# Patient Record
Sex: Male | Born: 1965 | Race: White | Hispanic: No | Marital: Single | Smoking: Current every day smoker
Health system: Southern US, Community
[De-identification: ages and names within clinical notes are randomized; demographics above are authoritative.]

## PROBLEM LIST (undated history)

## (undated) DIAGNOSIS — I10 Essential (primary) hypertension: Secondary | ICD-10-CM

## (undated) DIAGNOSIS — C140 Malignant neoplasm of pharynx, unspecified: Secondary | ICD-10-CM

## (undated) HISTORY — PX: OTHER SURGICAL HISTORY: SHX169

---

## 2013-10-04 ENCOUNTER — Emergency Department: Payer: Self-pay | Admitting: Emergency Medicine

## 2014-04-10 ENCOUNTER — Emergency Department: Payer: Self-pay | Admitting: Emergency Medicine

## 2015-01-15 ENCOUNTER — Emergency Department
Admit: 2015-01-15 | Disposition: A | Discharge status: Home / Self Care | Payer: Self-pay | Admitting: Emergency Medicine

## 2015-03-04 ENCOUNTER — Encounter: Payer: Self-pay | Admitting: Emergency Medicine

## 2015-03-04 ENCOUNTER — Emergency Department: Payer: Self-pay

## 2015-03-04 ENCOUNTER — Emergency Department
Admission: EM | Admit: 2015-03-04 | Discharge: 2015-03-04 | Disposition: A | Discharge status: Home / Self Care | Payer: Self-pay | Attending: Emergency Medicine | Admitting: Emergency Medicine

## 2015-03-04 DIAGNOSIS — W1849XA Other slipping, tripping and stumbling without falling, initial encounter: Secondary | ICD-10-CM | POA: Insufficient documentation

## 2015-03-04 DIAGNOSIS — Y998 Other external cause status: Secondary | ICD-10-CM | POA: Insufficient documentation

## 2015-03-04 DIAGNOSIS — Y9389 Activity, other specified: Secondary | ICD-10-CM | POA: Insufficient documentation

## 2015-03-04 DIAGNOSIS — Y9289 Other specified places as the place of occurrence of the external cause: Secondary | ICD-10-CM | POA: Insufficient documentation

## 2015-03-04 DIAGNOSIS — Z9889 Other specified postprocedural states: Secondary | ICD-10-CM | POA: Insufficient documentation

## 2015-03-04 DIAGNOSIS — Z720 Tobacco use: Secondary | ICD-10-CM | POA: Insufficient documentation

## 2015-03-04 DIAGNOSIS — S93401A Sprain of unspecified ligament of right ankle, initial encounter: Secondary | ICD-10-CM | POA: Insufficient documentation

## 2015-03-04 NOTE — ED Notes (Signed)
Pt c/o right ankle pain/swelling since last night around midnight; tripped in his bathroom and twisted the ankle; pt says he injured the same ankle in 1993 when he fell off a roof and has had intermittent problems with it since

## 2015-03-04 NOTE — ED Notes (Signed)
NAD noted at time of D/C. Pt denies questions or concerns. Pt taken to the lobby via wheelchair at this time.  

## 2015-03-04 NOTE — ED Notes (Signed)
Pt offered wheelchair at time of D/C. Pt refused wheelchair stating he would rather walk out.

## 2015-03-04 NOTE — ED Provider Notes (Signed)
[facility] Emergency Department Provider Note  ____________________________________________  Time seen: Approximately 620 AM  I have reviewed the triage vital signs and the nursing notes.   HISTORY  Chief Complaint Ankle Pain    HPI Alan Chung is a 49 y.o. male with a history of remote right ankle fracture presents tonight after tripping in the bathroom last night and now with anterior ankle pain. Patient previously had surgery on the ankle in 1993 when he fell from roof. Able to ambulate on the ankle at this time. Says has mildly increased swelling. Pain is anterior and sharp. Says always has some degree of swelling to the right ankle.did not hit head when fell. No loss of consciousness.   History reviewed. No pertinent past medical history.  There are no active problems to display for this patient.   Past Surgical History  Procedure Laterality Date  . Right ankle surgery      Current Outpatient Rx  Name  Route  Sig  Dispense  Refill  . ibuprofen (ADVIL,MOTRIN) 800 MG tablet   Oral   Take 800 mg by mouth every 8 (eight) hours as needed.           Allergies Tramadol  No family history on file.  Social History History  Substance Use Topics  . Smoking status: Current Every Day Smoker  . Smokeless tobacco: Never Used  . Alcohol Use: No    Review of Systems Constitutional: No fever/chills Eyes: No visual changes. ENT: No sore throat. Cardiovascular: Denies chest pain. Respiratory: Denies shortness of breath. Gastrointestinal: No abdominal pain.  No nausea, no vomiting.  No diarrhea.  No constipation. Genitourinary: Negative for dysuria. Musculoskeletal: Negative for back pain. Skin: Negative for rash. Neurological: Negative for headaches, focal weakness or numbness.  10-point ROS otherwise negative.  ____________________________________________   PHYSICAL EXAM:  VITAL SIGNS: ED Triage Vitals  Enc Vitals Group     BP  03/04/15 0531 147/82 mmHg     Pulse Rate 03/04/15 0531 81     Resp 03/04/15 0531 18     Temp 03/04/15 0531 97.8 F (36.6 C)     Temp Source 03/04/15 0531 Oral     SpO2 03/04/15 0531 99 %     Weight 03/04/15 0531 240 lb (108.863 kg)     Height 03/04/15 0531 6\' 1"  (1.854 m)     Head Cir --      Peak Flow --      Pain Score 03/04/15 0532 10     Pain Loc --      Pain Edu? --      Excl. in [place]? --     Constitutional: Alert and oriented. Well appearing and in no acute distress. Eyes: Conjunctivae are normal. PERRL. EOMI. Head: Atraumatic. Nose: No congestion/rhinnorhea. Mouth/Throat: Mucous membranes are moist.  Oropharynx non-erythematous. Neck: No stridor.   Cardiovascular: Normal rate, regular rhythm. Grossly normal heart sounds.  Good peripheral circulation. Respiratory: Normal respiratory effort.  No retractions. Lungs CTAB. Gastrointestinal: Soft and nontender. No distention. No abdominal bruits. No CVA tenderness. Musculoskeletal: right ankle swollen with chronic changes related to past surgery. Has overlying anterior scar. There is no tenderness to the bilateral malleoli. There is a remote deformity. The patient is able to range the ankle but with pain anteriorly. There is intact or South pedis pulse. The patient has full range of motion of the toes. Mild edema which patient says is chronic. Neurologic:  Normal speech and language. No gross focal neurologic  deficits are appreciated. Speech is normal. No gait instability. Skin:  Skin is warm, dry and intact. No rash noted. Psychiatric: Mood and affect are normal. Speech and behavior are normal.  ____________________________________________   LABS (all labs ordered are listed, but only abnormal results are displayed)  Labs Reviewed - No data to display ____________________________________________  EKG   ____________________________________________  RADIOLOGY  No acute fracture or deformity dislocation. Remote  posttraumatic changes. ____________________________________________   PROCEDURES   ____________________________________________   INITIAL IMPRESSION / ASSESSMENT AND PLAN / ED COURSE  Pertinent labs & imaging results that were available during my care of the patient were reviewed by me and considered in my medical decision making (see chart for details). Patient with ankle sprain. Will apply Ace wrap. Patient to follow up with primary care. Advised to use ice and elevated with pillows. We'll discharge home.offered Naprosyn the patient refuses. ____________________________________________   FINAL CLINICAL IMPRESSION(S) / ED DIAGNOSES  Acute right ankle sprain. Initial visit.    Orbie Pyo, MD 03/04/15 [phone]

## 2015-03-04 NOTE — Discharge Instructions (Signed)

## 2018-08-23 ENCOUNTER — Emergency Department: Payer: Medicaid Other

## 2018-08-23 ENCOUNTER — Emergency Department
Admission: EM | Admit: 2018-08-23 | Discharge: 2018-08-24 | Disposition: A | Discharge status: Home / Self Care | Payer: Medicaid Other | Attending: Emergency Medicine | Admitting: Emergency Medicine

## 2018-08-23 ENCOUNTER — Encounter: Payer: Self-pay | Admitting: Emergency Medicine

## 2018-08-23 ENCOUNTER — Other Ambulatory Visit: Payer: Self-pay

## 2018-08-23 DIAGNOSIS — F17200 Nicotine dependence, unspecified, uncomplicated: Secondary | ICD-10-CM | POA: Insufficient documentation

## 2018-08-23 DIAGNOSIS — R0602 Shortness of breath: Secondary | ICD-10-CM | POA: Diagnosis not present

## 2018-08-23 DIAGNOSIS — J029 Acute pharyngitis, unspecified: Secondary | ICD-10-CM | POA: Diagnosis present

## 2018-08-23 DIAGNOSIS — J40 Bronchitis, not specified as acute or chronic: Secondary | ICD-10-CM | POA: Insufficient documentation

## 2018-08-23 DIAGNOSIS — J040 Acute laryngitis: Secondary | ICD-10-CM | POA: Diagnosis not present

## 2018-08-23 LAB — CBC WITH DIFFERENTIAL/PLATELET
Abs Immature Granulocytes: 0.05 10*3/uL (ref 0.00–0.07)
Basophils Absolute: 0.1 10*3/uL (ref 0.0–0.1)
Basophils Relative: 1 %
Eosinophils Absolute: 0.3 10*3/uL (ref 0.0–0.5)
Eosinophils Relative: 3 %
HCT: 41.9 % (ref 39.0–52.0)
Hemoglobin: 13.9 g/dL (ref 13.0–17.0)
Immature Granulocytes: 1 %
Lymphocytes Relative: 29 %
Lymphs Abs: 3 10*3/uL (ref 0.7–4.0)
MCH: 28.5 pg (ref 26.0–34.0)
MCHC: 33.2 g/dL (ref 30.0–36.0)
MCV: 86 fL (ref 80.0–100.0)
Monocytes Absolute: 0.7 10*3/uL (ref 0.1–1.0)
Monocytes Relative: 7 %
Neutro Abs: 6 10*3/uL (ref 1.7–7.7)
Neutrophils Relative %: 59 %
Platelets: 240 10*3/uL (ref 150–400)
RBC: 4.87 MIL/uL (ref 4.22–5.81)
RDW: 12.8 % (ref 11.5–15.5)
WBC: 10.1 10*3/uL (ref 4.0–10.5)
nRBC: 0 % (ref 0.0–0.2)

## 2018-08-23 LAB — GROUP A STREP BY PCR: Group A Strep by PCR: NOT DETECTED

## 2018-08-23 MED ORDER — IPRATROPIUM-ALBUTEROL 0.5-2.5 (3) MG/3ML IN SOLN
3.0000 mL | Freq: Once | RESPIRATORY_TRACT | Status: AC
  Administered 2018-08-23: 3 mL via RESPIRATORY_TRACT
  Filled 2018-08-23: qty 3

## 2018-08-23 MED ORDER — PREDNISONE 20 MG PO TABS
60.0000 mg | ORAL_TABLET | Freq: Once | ORAL | Status: AC
  Administered 2018-08-23: 60 mg via ORAL
  Filled 2018-08-23: qty 3

## 2018-08-23 NOTE — ED Provider Notes (Signed)
[facility] Emergency Department Provider Note  ____________________________________________  Time seen: Approximately 11:40 PM  I have reviewed the triage vital signs and the nursing notes.   HISTORY  Chief Complaint Sore Throat    HPI Alan Chung is a 52 y.o. male presents to the emergency department with laryngitis for 1 to 2 months, intermittent pharyngitis and 20 pounds of weight loss.  Patient reports that weight loss has been unintentional.  Patient is a daily smoker.  He has also had shortness of breath and a sensation of wheezing.  He has never been diagnosed with COPD or other respiratory issues in the past.  Patient is accompanied by his brother who is also concerned.  He denies chest pain. Patient has been taking Mucinex at home which is not alleviating his symptoms.   History reviewed. No pertinent past medical history.  There are no active problems to display for this patient.   Past Surgical History:  Procedure Laterality Date  . right ankle surgery      Prior to Admission medications   Medication Sig Start Date End Date Taking? Authorizing Provider  albuterol (PROVENTIL HFA;VENTOLIN HFA) 108 (90 Base) MCG/ACT inhaler Inhale 2 puffs into the lungs every 6 (six) hours as needed for wheezing or shortness of breath. 08/24/18   Lannie Fields, PA-C  ibuprofen (ADVIL,MOTRIN) 800 MG tablet Take 800 mg by mouth every 8 (eight) hours as needed.    [provider]  predniSONE (DELTASONE) 50 MG tablet Take one 50 mg tablet once daily for the next five days. 08/24/18   Lannie Fields, PA-C    Allergies Tramadol  History reviewed. No pertinent family history.  Social History Social History   Tobacco Use  . Smoking status: Current Every Day Smoker  . Smokeless tobacco: Never Used  Substance Use Topics  . Alcohol use: No  . Drug use: No     Review of Systems  Constitutional: No fever/chills Eyes: No visual changes. No  discharge ENT: No upper respiratory complaints. Cardiovascular: no chest pain. Respiratory: Patient has had wheezing, cough and intermittent shortness of breath  Gastrointestinal: No abdominal pain.  No nausea, no vomiting.  No diarrhea.  No constipation. Genitourinary: Negative for dysuria. No hematuria Musculoskeletal: Negative for musculoskeletal pain. Skin: Negative for rash, abrasions, lacerations, ecchymosis. Neurological: Negative for headaches, focal weakness or numbness.   ____________________________________________   PHYSICAL EXAM:  VITAL SIGNS: ED Triage Vitals  Enc Vitals Group     BP 08/23/18 2242 (!) 142/84     Pulse Rate 08/23/18 2240 77     Resp 08/23/18 2240 20     Temp 08/23/18 2240 98.7 F (37.1 C)     Temp Source 08/23/18 2240 Oral     SpO2 08/23/18 2240 99 %     Weight 08/23/18 2240 222 lb (100.7 kg)     Height 08/23/18 2240 6\' 1"  (1.854 m)     Head Circumference --      Peak Flow --      Pain Score 08/23/18 2240 8     Pain Loc --      Pain Edu? --      Excl. in [place]? --      Constitutional: Alert and oriented. Well appearing and in no acute distress. Eyes: Conjunctivae are normal. PERRL. EOMI. Head: Atraumatic. ENT:      Ears: TMs are pearly.       Nose: No congestion/rhinnorhea.      Mouth/Throat: Mucous membranes are  moist.  Posterior pharynx is mildly erythematous. Neck: No stridor.  No cervical spine tenderness to palpation. Cardiovascular: Normal rate, regular rhythm. Normal S1 and S2.  Good peripheral circulation. Respiratory: Normal respiratory effort without tachypnea or retractions.  Diffuse wheezing auscultated bilaterally.  Good air entry to the bases with no decreased or absent breath sounds. Gastrointestinal: Bowel sounds 4 quadrants. Soft and nontender to palpation. No guarding or rigidity. No palpable masses. No distention. No CVA tenderness. Musculoskeletal: Full range of motion to all extremities. No gross deformities  appreciated. Neurologic:  Normal speech and language. No gross focal neurologic deficits are appreciated.  Skin:  Skin is warm, dry and intact. No rash noted. Psychiatric: Mood and affect are normal. Speech and behavior are normal. Patient exhibits appropriate insight and judgement.   ____________________________________________   LABS (all labs ordered are listed, but only abnormal results are displayed)  Labs Reviewed  COMPREHENSIVE METABOLIC PANEL - Abnormal; Notable for the following components:      Result Value   Glucose, Bld 101 (*)    All other components within normal limits  GROUP A STREP BY PCR  CBC WITH DIFFERENTIAL/PLATELET   ____________________________________________  EKG   ____________________________________________  RADIOLOGY I personally viewed and evaluated these images as part of my medical decision making, as well as reviewing the written report by the radiologist.    Dg Chest 2 View  Result Date: 08/23/2018 CLINICAL DATA:  Sore throat for 2 months. Laryngitis. Nonproductive cough. Smoker. EXAM: CHEST - 2 VIEW COMPARISON:  None. FINDINGS: Normal heart size and pulmonary vascularity. No airspace disease or consolidation in the lungs. Blunting of the left costophrenic angle likely representing a small pleural effusion. No pneumothorax. Mediastinal contours appear intact. IMPRESSION: Small left pleural effusion. No evidence of active pulmonary disease. Electronically Signed   By: Lucienne Capers M.D.   On: 08/23/2018 23:46    ____________________________________________    PROCEDURES  Procedure(s) performed:    Procedures    Medications  predniSONE (DELTASONE) tablet 60 mg (60 mg Oral Given 08/23/18 2339)  ipratropium-albuterol (DUONEB) 0.5-2.5 (3) MG/3ML nebulizer solution 3 mL (3 mLs Nebulization Given 08/23/18 2340)     ____________________________________________   INITIAL IMPRESSION / ASSESSMENT AND PLAN / ED COURSE  Pertinent  labs & imaging results that were available during my care of the patient were reviewed by me and considered in my medical decision making (see chart for details).  Review of the [place] CSRS was performed in accordance of the [place] prior to dispensing any controlled drugs.      Assessment and Plan:  Laryngitis Bronchitis Patient presents to the emergency department with laryngitis for the past 1 to 2 months and reports of weight loss.  Patient has also had a sensation of wheezing and intermittent cough.  X-ray reveals no opacities or consolidations that would suggest community-acquired pneumonia.  No masses or lesions were identified on chest x-ray.  No leukocytosis on CBC.  Electrolytes were within reference range.  Patient's wheezing improved with DuoNeb given in the emergency department.  I advocated for smoking cessation in the emergency department.  Patient was discharged with prednisone and albuterol inhaler.  He was advised to follow-up with primary care if symptoms persist.  Strict return precautions were given to return to the emergency department for new or worsening symptoms.  All patient questions were answered.   ____________________________________________  FINAL CLINICAL IMPRESSION(S) / ED DIAGNOSES  Final diagnoses:  Bronchitis  Laryngitis      NEW MEDICATIONS STARTED DURING  THIS VISIT:  ED Discharge Orders         Ordered    albuterol (PROVENTIL HFA;VENTOLIN HFA) 108 (90 Base) MCG/ACT inhaler  Every 6 hours PRN     08/24/18 0008    predniSONE (DELTASONE) 50 MG tablet     08/24/18 0008              This chart was dictated using voice recognition software/Dragon. Despite best efforts to proofread, errors can occur which can change the meaning. Any change was purely unintentional.    Lannie Fields, PA-C 08/24/18 Jen Mow    Nance Pear, MD 08/26/18 1131

## 2018-08-23 NOTE — ED Triage Notes (Signed)
Pt reports that he has a sore throat, he reports that he has had it for two months ago. He reports that it gets better than gets worse. Pt has laryngitis at this time. Pt has non productive cough.

## 2018-08-24 LAB — COMPREHENSIVE METABOLIC PANEL
ALT: 20 U/L (ref 0–44)
AST: 22 U/L (ref 15–41)
Albumin: 3.8 g/dL (ref 3.5–5.0)
Alkaline Phosphatase: 60 U/L (ref 38–126)
Anion gap: 9 (ref 5–15)
BUN: 15 mg/dL (ref 6–20)
CO2: 28 mmol/L (ref 22–32)
Calcium: 9.1 mg/dL (ref 8.9–10.3)
Chloride: 100 mmol/L (ref 98–111)
Creatinine, Ser: 0.64 mg/dL (ref 0.61–1.24)
GFR calc Af Amer: >60 mL/min (ref >60)
GFR calc non Af Amer: >60 mL/min (ref >60)
Glucose, Bld: 101 mg/dL — ABNORMAL HIGH (ref 70–99)
Potassium: 4.1 mmol/L (ref 3.5–5.1)
Sodium: 137 mmol/L (ref 135–145)
Total Bilirubin: 0.5 mg/dL (ref 0.3–1.2)
Total Protein: 7.9 g/dL (ref 6.5–8.1)

## 2018-08-24 MED ORDER — PREDNISONE 50 MG PO TABS: ORAL_TABLET | ORAL | 0 refills | Status: DC

## 2018-08-24 MED ORDER — ALBUTEROL SULFATE HFA 108 (90 BASE) MCG/ACT IN AERS: 2.0000 | INHALATION_SPRAY | Freq: Four times a day (QID) | RESPIRATORY_TRACT | 2 refills | Status: AC | PRN

## 2018-08-24 NOTE — ED Notes (Signed)
Pt ambulatory to POV without difficulty. VSS. NAD. Discharge instructions, RX and follow up reviewed. All questions and concerns addressed.  

## 2018-09-10 ENCOUNTER — Other Ambulatory Visit: Payer: Self-pay

## 2018-09-10 ENCOUNTER — Inpatient Hospital Stay
Admission: EM | Admit: 2018-09-10 | Discharge: 2018-09-20 | DRG: 011 | Disposition: A | Discharge status: Home Health | Payer: Medicaid Other | Attending: Internal Medicine | Admitting: Internal Medicine

## 2018-09-10 ENCOUNTER — Inpatient Hospital Stay: Payer: Medicaid Other

## 2018-09-10 ENCOUNTER — Telehealth: Payer: Self-pay | Admitting: Internal Medicine

## 2018-09-10 ENCOUNTER — Emergency Department: Payer: Medicaid Other

## 2018-09-10 ENCOUNTER — Emergency Department: Payer: Medicaid Other | Admitting: Anesthesiology

## 2018-09-10 ENCOUNTER — Encounter
Admission: EM | Disposition: A | Discharge status: Home Health | Payer: Self-pay | Source: Home / Self Care | Attending: Internal Medicine

## 2018-09-10 ENCOUNTER — Encounter: Payer: Self-pay | Admitting: Emergency Medicine

## 2018-09-10 DIAGNOSIS — R221 Localized swelling, mass and lump, neck: Secondary | ICD-10-CM

## 2018-09-10 DIAGNOSIS — R739 Hyperglycemia, unspecified: Secondary | ICD-10-CM | POA: Diagnosis not present

## 2018-09-10 DIAGNOSIS — I10 Essential (primary) hypertension: Secondary | ICD-10-CM | POA: Diagnosis present

## 2018-09-10 DIAGNOSIS — Z9889 Other specified postprocedural states: Secondary | ICD-10-CM

## 2018-09-10 DIAGNOSIS — J9601 Acute respiratory failure with hypoxia: Secondary | ICD-10-CM | POA: Diagnosis present

## 2018-09-10 DIAGNOSIS — T17908A Unspecified foreign body in respiratory tract, part unspecified causing other injury, initial encounter: Secondary | ICD-10-CM

## 2018-09-10 DIAGNOSIS — J9801 Acute bronchospasm: Secondary | ICD-10-CM | POA: Diagnosis present

## 2018-09-10 DIAGNOSIS — Z430 Encounter for attention to tracheostomy: Secondary | ICD-10-CM

## 2018-09-10 DIAGNOSIS — Z931 Gastrostomy status: Secondary | ICD-10-CM

## 2018-09-10 DIAGNOSIS — E440 Moderate protein-calorie malnutrition: Secondary | ICD-10-CM | POA: Diagnosis present

## 2018-09-10 DIAGNOSIS — Z930 Tracheostomy status: Secondary | ICD-10-CM

## 2018-09-10 DIAGNOSIS — Z6826 Body mass index (BMI) 26.0-26.9, adult: Secondary | ICD-10-CM

## 2018-09-10 DIAGNOSIS — J387 Other diseases of larynx: Secondary | ICD-10-CM

## 2018-09-10 DIAGNOSIS — F17200 Nicotine dependence, unspecified, uncomplicated: Secondary | ICD-10-CM | POA: Diagnosis present

## 2018-09-10 DIAGNOSIS — J9600 Acute respiratory failure, unspecified whether with hypoxia or hypercapnia: Secondary | ICD-10-CM | POA: Diagnosis present

## 2018-09-10 DIAGNOSIS — C329 Malignant neoplasm of larynx, unspecified: Secondary | ICD-10-CM

## 2018-09-10 DIAGNOSIS — D649 Anemia, unspecified: Secondary | ICD-10-CM | POA: Diagnosis present

## 2018-09-10 DIAGNOSIS — J040 Acute laryngitis: Secondary | ICD-10-CM | POA: Diagnosis present

## 2018-09-10 DIAGNOSIS — Z9119 Patient's noncompliance with other medical treatment and regimen: Secondary | ICD-10-CM

## 2018-09-10 DIAGNOSIS — C321 Malignant neoplasm of supraglottis: Secondary | ICD-10-CM | POA: Diagnosis present

## 2018-09-10 DIAGNOSIS — E8770 Fluid overload, unspecified: Secondary | ICD-10-CM | POA: Diagnosis not present

## 2018-09-10 DIAGNOSIS — R0600 Dyspnea, unspecified: Secondary | ICD-10-CM

## 2018-09-10 DIAGNOSIS — R609 Edema, unspecified: Secondary | ICD-10-CM

## 2018-09-10 DIAGNOSIS — Z79899 Other long term (current) drug therapy: Secondary | ICD-10-CM | POA: Diagnosis not present

## 2018-09-10 DIAGNOSIS — J449 Chronic obstructive pulmonary disease, unspecified: Secondary | ICD-10-CM | POA: Diagnosis present

## 2018-09-10 DIAGNOSIS — J690 Pneumonitis due to inhalation of food and vomit: Secondary | ICD-10-CM | POA: Diagnosis present

## 2018-09-10 DIAGNOSIS — Z4659 Encounter for fitting and adjustment of other gastrointestinal appliance and device: Secondary | ICD-10-CM

## 2018-09-10 DIAGNOSIS — R1110 Vomiting, unspecified: Secondary | ICD-10-CM

## 2018-09-10 HISTORY — DX: Essential (primary) hypertension: I10

## 2018-09-10 HISTORY — PX: DIAGNOSTIC LARYNGOSCOPY: SHX5368

## 2018-09-10 HISTORY — PX: TRACHEOSTOMY TUBE PLACEMENT: SHX814

## 2018-09-10 LAB — CBC WITH DIFFERENTIAL/PLATELET
Abs Immature Granulocytes: 0.04 10*3/uL (ref 0.00–0.07)
Basophils Absolute: 0.1 10*3/uL (ref 0.0–0.1)
Basophils Relative: 1 %
Eosinophils Absolute: 0.1 10*3/uL (ref 0.0–0.5)
Eosinophils Relative: 2 %
HCT: 45.2 % (ref 39.0–52.0)
Hemoglobin: 14.6 g/dL (ref 13.0–17.0)
IMMATURE GRANULOCYTES: 0 %
Lymphocytes Relative: 15 %
Lymphs Abs: 1.4 10*3/uL (ref 0.7–4.0)
MCH: 28.1 pg (ref 26.0–34.0)
MCHC: 32.3 g/dL (ref 30.0–36.0)
MCV: 87.1 fL (ref 80.0–100.0)
Monocytes Absolute: 0.6 10*3/uL (ref 0.1–1.0)
Monocytes Relative: 7 %
NEUTROS ABS: 6.9 10*3/uL (ref 1.7–7.7)
NEUTROS PCT: 75 %
NRBC: 0 % (ref 0.0–0.2)
PLATELETS: 227 10*3/uL (ref 150–400)
RBC: 5.19 MIL/uL (ref 4.22–5.81)
RDW: 12.8 % (ref 11.5–15.5)
WBC: 9.2 10*3/uL (ref 4.0–10.5)

## 2018-09-10 LAB — BLOOD GAS, ARTERIAL
Acid-Base Excess: 4.2 mmol/L — ABNORMAL HIGH (ref 0.0–2.0)
Allens test (pass/fail): POSITIVE — AB
Bicarbonate: 31.1 mmol/L — ABNORMAL HIGH (ref 20.0–28.0)
FIO2: 35
MECHVT: 500 mL
O2 Saturation: 97.9 %
PCO2 ART: 55 mmHg — AB (ref 32.0–48.0)
PEEP/CPAP: 5 cmH2O
Patient temperature: 37
RATE: 15 resp/min
pH, Arterial: 7.36 (ref 7.350–7.450)
pO2, Arterial: 107 mmHg (ref 83.0–108.0)

## 2018-09-10 LAB — COMPREHENSIVE METABOLIC PANEL
ALBUMIN: 4 g/dL (ref 3.5–5.0)
ALT: 19 U/L (ref 0–44)
ANION GAP: 7 (ref 5–15)
AST: 22 U/L (ref 15–41)
Alkaline Phosphatase: 58 U/L (ref 38–126)
BUN: 10 mg/dL (ref 6–20)
CO2: 32 mmol/L (ref 22–32)
Calcium: 8.9 mg/dL (ref 8.9–10.3)
Chloride: 98 mmol/L (ref 98–111)
Creatinine, Ser: 0.73 mg/dL (ref 0.61–1.24)
GFR calc Af Amer: >60 mL/min (ref >60)
Glucose, Bld: 119 mg/dL — ABNORMAL HIGH (ref 70–99)
POTASSIUM: 3.7 mmol/L (ref 3.5–5.1)
Sodium: 137 mmol/L (ref 135–145)
TOTAL PROTEIN: 8.1 g/dL (ref 6.5–8.1)
Total Bilirubin: 0.5 mg/dL (ref 0.3–1.2)

## 2018-09-10 LAB — TROPONIN I: Troponin I: <0.03 ng/mL (ref <0.03)

## 2018-09-10 LAB — TRIGLYCERIDES: Triglycerides: 70 mg/dL (ref <150)

## 2018-09-10 LAB — GLUCOSE, CAPILLARY: GLUCOSE-CAPILLARY: 186 mg/dL — AB (ref 70–99)

## 2018-09-10 LAB — MRSA PCR SCREENING: MRSA by PCR: NEGATIVE

## 2018-09-10 SURGERY — CREATION, TRACHEOSTOMY
Anesthesia: Monitor Anesthesia Care

## 2018-09-10 MED ORDER — CHLORHEXIDINE GLUCONATE 0.12% ORAL RINSE (MEDLINE KIT)
15.0000 mL | Freq: Two times a day (BID) | OROMUCOSAL | Status: DC
  Administered 2018-09-10 – 2018-09-18 (×12): 15 mL via OROMUCOSAL
  Filled 2018-09-10: qty 15

## 2018-09-10 MED ORDER — PROPOFOL 1000 MG/100ML IV EMUL
5.0000 ug/kg/min | INTRAVENOUS | Status: DC
  Administered 2018-09-10: 20 ug/kg/min via INTRAVENOUS

## 2018-09-10 MED ORDER — DEXAMETHASONE SODIUM PHOSPHATE 10 MG/ML IJ SOLN
INTRAMUSCULAR | Status: AC
  Filled 2018-09-10: qty 1

## 2018-09-10 MED ORDER — ORAL CARE MOUTH RINSE
15.0000 mL | OROMUCOSAL | Status: DC
  Administered 2018-09-10 – 2018-09-20 (×63): 15 mL via OROMUCOSAL

## 2018-09-10 MED ORDER — DEXAMETHASONE SODIUM PHOSPHATE 4 MG/ML IJ SOLN
4.0000 mg | Freq: Once | INTRAMUSCULAR | Status: AC
  Administered 2018-09-10: 4 mg via INTRAVENOUS
  Filled 2018-09-10: qty 1

## 2018-09-10 MED ORDER — PROPOFOL 10 MG/ML IV BOLUS
INTRAVENOUS | Status: AC
  Filled 2018-09-10: qty 40

## 2018-09-10 MED ORDER — FENTANYL CITRATE (PF) 100 MCG/2ML IJ SOLN
50.0000 ug | Freq: Once | INTRAMUSCULAR | Status: AC
  Administered 2018-09-10: 50 ug via INTRAVENOUS

## 2018-09-10 MED ORDER — FENTANYL 2500MCG IN NS 250ML (10MCG/ML) PREMIX INFUSION
25.0000 ug/h | INTRAVENOUS | Status: DC
  Administered 2018-09-10: 100 ug/h via INTRAVENOUS
  Administered 2018-09-11: 325 ug/h via INTRAVENOUS
  Administered 2018-09-11: 300 ug/h via INTRAVENOUS
  Administered 2018-09-11 – 2018-09-12 (×2): 325 ug/h via INTRAVENOUS
  Filled 2018-09-10 (×5): qty 250

## 2018-09-10 MED ORDER — CEFAZOLIN SODIUM-DEXTROSE 2-3 GM-%(50ML) IV SOLR
INTRAVENOUS | Status: DC | PRN
  Administered 2018-09-10: 2 g via INTRAVENOUS

## 2018-09-10 MED ORDER — PROPOFOL 10 MG/ML IV BOLUS
INTRAVENOUS | Status: DC | PRN
  Administered 2018-09-10: 50 mg via INTRAVENOUS
  Administered 2018-09-10: 170 mg via INTRAVENOUS
  Administered 2018-09-10: 30 mg via INTRAVENOUS

## 2018-09-10 MED ORDER — PANTOPRAZOLE SODIUM 40 MG IV SOLR
40.0000 mg | Freq: Every day | INTRAVENOUS | Status: DC
  Administered 2018-09-10 – 2018-09-19 (×10): 40 mg via INTRAVENOUS
  Filled 2018-09-10 (×10): qty 40

## 2018-09-10 MED ORDER — ENOXAPARIN SODIUM 40 MG/0.4ML ~~LOC~~ SOLN
40.0000 mg | SUBCUTANEOUS | Status: DC
  Administered 2018-09-11 – 2018-09-15 (×5): 40 mg via SUBCUTANEOUS
  Filled 2018-09-10 (×5): qty 0.4

## 2018-09-10 MED ORDER — LIDOCAINE-EPINEPHRINE 1 %-1:100000 IJ SOLN
INTRAMUSCULAR | Status: DC | PRN
  Administered 2018-09-10: 9 mL

## 2018-09-10 MED ORDER — FENTANYL CITRATE (PF) 100 MCG/2ML IJ SOLN
INTRAMUSCULAR | Status: DC | PRN
  Administered 2018-09-10: 50 ug via INTRAVENOUS

## 2018-09-10 MED ORDER — IPRATROPIUM-ALBUTEROL 0.5-2.5 (3) MG/3ML IN SOLN
3.0000 mL | Freq: Four times a day (QID) | RESPIRATORY_TRACT | Status: DC
  Administered 2018-09-10 – 2018-09-14 (×16): 3 mL via RESPIRATORY_TRACT
  Filled 2018-09-10 (×16): qty 3

## 2018-09-10 MED ORDER — IOHEXOL 350 MG/ML SOLN
75.0000 mL | Freq: Once | INTRAVENOUS | Status: AC | PRN
  Administered 2018-09-10: 75 mL via INTRAVENOUS

## 2018-09-10 MED ORDER — DEXAMETHASONE SODIUM PHOSPHATE 10 MG/ML IJ SOLN
6.0000 mg | Freq: Once | INTRAMUSCULAR | Status: AC
  Administered 2018-09-10: 6 mg via INTRAVENOUS

## 2018-09-10 MED ORDER — PROPOFOL 1000 MG/100ML IV EMUL
5.0000 ug/kg/min | INTRAVENOUS | Status: DC
  Administered 2018-09-10 (×2): 30 ug/kg/min via INTRAVENOUS
  Administered 2018-09-11: 29.97 ug/kg/min via INTRAVENOUS
  Administered 2018-09-11 (×3): 30 ug/kg/min via INTRAVENOUS
  Administered 2018-09-11: 29.972 ug/kg/min via INTRAVENOUS
  Administered 2018-09-12: 30 ug/kg/min via INTRAVENOUS
  Filled 2018-09-10 (×7): qty 100

## 2018-09-10 MED ORDER — FENTANYL BOLUS VIA INFUSION
50.0000 ug | INTRAVENOUS | Status: DC | PRN
  Filled 2018-09-10: qty 50

## 2018-09-10 MED ORDER — FENTANYL CITRATE (PF) 100 MCG/2ML IJ SOLN
INTRAMUSCULAR | Status: AC
  Filled 2018-09-10: qty 2

## 2018-09-10 MED ORDER — ONDANSETRON HCL 4 MG/2ML IJ SOLN
INTRAMUSCULAR | Status: DC | PRN
  Administered 2018-09-10: 4 mg via INTRAVENOUS

## 2018-09-10 MED ORDER — MIDAZOLAM HCL 2 MG/2ML IJ SOLN
INTRAMUSCULAR | Status: AC
  Filled 2018-09-10: qty 2

## 2018-09-10 MED ORDER — SUCCINYLCHOLINE CHLORIDE 20 MG/ML IJ SOLN
INTRAMUSCULAR | Status: DC | PRN
  Administered 2018-09-10: 100 mg via INTRAVENOUS

## 2018-09-10 MED ORDER — KETAMINE HCL 50 MG/ML IJ SOLN
INTRAMUSCULAR | Status: AC
  Filled 2018-09-10: qty 10

## 2018-09-10 MED ORDER — ONDANSETRON HCL 4 MG/2ML IJ SOLN: 4.0000 mg | Freq: Four times a day (QID) | INTRAMUSCULAR | Status: DC | PRN

## 2018-09-10 MED ORDER — MIDAZOLAM HCL 2 MG/2ML IJ SOLN
INTRAMUSCULAR | Status: DC | PRN
  Administered 2018-09-10: 2 mg via INTRAVENOUS

## 2018-09-10 MED ORDER — METHYLPREDNISOLONE SODIUM SUCC 125 MG IJ SOLR
60.0000 mg | Freq: Four times a day (QID) | INTRAMUSCULAR | Status: DC
  Administered 2018-09-10 – 2018-09-13 (×12): 60 mg via INTRAVENOUS
  Filled 2018-09-10 (×12): qty 2

## 2018-09-10 MED ORDER — DEXAMETHASONE SODIUM PHOSPHATE 10 MG/ML IJ SOLN
INTRAMUSCULAR | Status: DC | PRN
  Administered 2018-09-10: 10 mg via INTRAVENOUS

## 2018-09-10 MED ORDER — SODIUM CHLORIDE 0.9 % IV SOLN
INTRAVENOUS | Status: DC
  Administered 2018-09-10 – 2018-09-16 (×12): via INTRAVENOUS

## 2018-09-10 MED ORDER — ONDANSETRON HCL 4 MG PO TABS: 4.0000 mg | ORAL_TABLET | Freq: Four times a day (QID) | ORAL | Status: DC | PRN

## 2018-09-10 MED ORDER — PROPOFOL 1000 MG/100ML IV EMUL
INTRAVENOUS | Status: AC
  Administered 2018-09-10: 20 ug/kg/min via INTRAVENOUS
  Filled 2018-09-10: qty 100

## 2018-09-10 MED ORDER — RACEPINEPHRINE HCL 2.25 % IN NEBU
0.2500 mL | INHALATION_SOLUTION | Freq: Once | RESPIRATORY_TRACT | Status: AC
  Administered 2018-09-10: 0.25 mL via RESPIRATORY_TRACT
  Filled 2018-09-10: qty 0.5

## 2018-09-10 MED ORDER — LACTATED RINGERS IV SOLN
INTRAVENOUS | Status: DC | PRN
  Administered 2018-09-10: 13:00:00 via INTRAVENOUS

## 2018-09-10 MED ORDER — IOHEXOL 300 MG/ML  SOLN
75.0000 mL | Freq: Once | INTRAMUSCULAR | Status: AC | PRN
  Administered 2018-09-10: 75 mL via INTRAVENOUS

## 2018-09-10 MED ORDER — DEXMEDETOMIDINE HCL IN NACL 200 MCG/50ML IV SOLN
INTRAVENOUS | Status: DC | PRN
  Administered 2018-09-10 (×7): 8 ug via INTRAVENOUS

## 2018-09-10 MED ORDER — DEXMEDETOMIDINE HCL IN NACL 80 MCG/20ML IV SOLN
INTRAVENOUS | Status: AC
  Filled 2018-09-10: qty 20

## 2018-09-10 SURGICAL SUPPLY — 32 items
BLADE SURG 15 STRL LF DISP TIS (BLADE) ×2 IMPLANT
BLADE SURG 15 STRL SS (BLADE) ×2
BLADE SURG SZ11 CARB STEEL (BLADE) ×4 IMPLANT
CANISTER SUCT 1200ML W/VALVE (MISCELLANEOUS) ×4 IMPLANT
CORD BIP STRL DISP 12FT (MISCELLANEOUS) ×4 IMPLANT
COVER WAND RF STERILE (DRAPES) IMPLANT
ELECT REM PT RETURN 9FT ADLT (ELECTROSURGICAL) ×4
ELECTRODE REM PT RTRN 9FT ADLT (ELECTROSURGICAL) ×2 IMPLANT
FORCEPS JEWEL BIP 4-3/4 STR (INSTRUMENTS) ×4 IMPLANT
GAUZE PACKING IODOFORM 1/2 (PACKING) IMPLANT
GLOVE BIO SURGEON STRL SZ7.5 (GLOVE) ×4 IMPLANT
GOWN STRL REUS W/ TWL LRG LVL3 (GOWN DISPOSABLE) ×4 IMPLANT
GOWN STRL REUS W/TWL LRG LVL3 (GOWN DISPOSABLE) ×4
HEMOSTAT SURGICEL 2X3 (HEMOSTASIS) ×4 IMPLANT
HLDR TRACH TUBE NECKBAND 18 (MISCELLANEOUS) ×2 IMPLANT
HOLDER TRACH TUBE NECKBAND 18 (MISCELLANEOUS) ×2
KIT TURNOVER KIT A (KITS) ×4 IMPLANT
LABEL OR SOLS (LABEL) ×4 IMPLANT
NS IRRIG 500ML POUR BTL (IV SOLUTION) ×4 IMPLANT
PACK HEAD/NECK (MISCELLANEOUS) ×4 IMPLANT
SHEARS HARMONIC 9CM CVD (BLADE) ×4 IMPLANT
SPONGE DRAIN TRACH 4X4 STRL 2S (GAUZE/BANDAGES/DRESSINGS) ×4 IMPLANT
SPONGE KITTNER 5P (MISCELLANEOUS) ×4 IMPLANT
SUCTION FRAZIER HANDLE 10FR (MISCELLANEOUS) ×2
SUCTION TUBE FRAZIER 10FR DISP (MISCELLANEOUS) ×2 IMPLANT
SUT SILK 2 0 (SUTURE) ×2
SUT SILK 2 0 SH (SUTURE) ×8 IMPLANT
SUT SILK 2-0 18XBRD TIE 12 (SUTURE) ×2 IMPLANT
SUT VIC AB 3-0 PS2 18 (SUTURE) ×4 IMPLANT
SYR 3ML LL SCALE MARK (SYRINGE) ×4 IMPLANT
TUBE TRACH SHILEY  6 DIST  CUF (TUBING) IMPLANT
TUBE TRACH SHILEY 8 DIST CUF (TUBING) ×4 IMPLANT

## 2018-09-10 NOTE — OR Nursing (Signed)
Patient had wallet that was given to security, paper and key returned, this was given to [facility]

## 2018-09-10 NOTE — Op Note (Signed)
..09/10/2018 2:39 PM  Shelly Flatten [id number]  Pre-Op Dx: Acute respiratory distress, supraglottic mass  Post-Op Dx:  same  Proc:  1)  AwakeTracheostomy  2)  Diagnostic laryngoscopy with biopsy  Surg:  Mykayla Brinton  Anes:  GOT  EBL:  <70ml  Comp:  none  Findings: Size 8 shiley placed in tracheal ring 2 and 3.  Exophytic mass extending from left base of tongue/epiglottis onto supraglottis bilaterally and involving both true vocal folds and extending to laryngeal surface of posterior commisure.  Unable to advance suction through laryngeal opening due to obstruction.    Procedure:  The patient was brought from the intensive care unit to the operating room and transferred to an operating table. Limited neck extension was achieved as possible and a shoulder roll was placed.  The lower neck was palpated with the findings as described above.  1% Xylocaine with 1:100,000 epinephrine, 13 cc's, was infiltrated into the surgical field for intraoperative hemostasis.  Several minutes were allowed for this to take effect. The patient was prepped in a sterile fashion with a surgical prep from the chin down to the upper chest.  Sterile draping was accomplished in the standard fashion except the patient's face was left open due to patient being awake.  A  5 cm horizontal incision was made sharply a finger's breadth above the sternal notch, and extended through skin and subcutaneous fat.  Using cautery, the superficial layer of the deep cervical fascia was lysed.  Additional dissection revealed the strap muscles.  The midline raphe was divided in two layers and the muscles retracted laterally.  The pretracheal plane was visualized.  This was entered bluntly.  The thyroid isthmus was isolated and divided with the Harmonic scalpel.  The thyroid gland was retracted to either side.  The anterior face of the trachea was cleared.  In the  2nd and 3rd interspace, a transverse incision was made between cartilage  rings into the tracheal lumen.  A 6 mm wide inferiorly based flap was generated.  At this time due to patient's significant movement, a bougie was placed into the patient's airway and a size 6 ETT was threaded over the bougie.  General anesthesia was then administered and the patient laid supine.   A previously tested  # 8 Shiley cuffed tracheostomy tube was brought into the field.  The tracheostomy tube was inserted into the tracheal lumen.  Hemostasis was observed. The cuff was inflated and observed to be intact and containing pressure. The inner cannula was placed and ventilation assumed per tracheostomy tube.  Good chest wall motion was observed, and CO2 was documented per anesthesia.  The trach tube was secured in the standard fashion with trach ties. A 2-0 silk suture was used to secure the trach tube to the skin on both sides.  Hemostasis was observed again.    At this time, a wettened raytec was placed into the patient's oral cavity.  A Dedo laryngoscope was inserted into the patient's oral cavity and advanced for visualization of the patient's pharynx.  This demonstrated a large exophytic mass replacing the left side of the patient's epiglottis and most of the patient's supraglottis and larynx.  No normal appearing larynx was able to be discerned other than the posterior aspect of the patient's right arytenoid.  This appeared to extend to the patient's post cricoid on the patient's left.  Multiple biopsies were taken of epiglottis and left supraglottis.  At this point the procedure was completed.  The patient was  returned to anesthesia, awakened as possible, and transferred back to the intensive care unit in stable condition.  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  Will need CT scan once able to tolerate as previous was limited by motion.  Admit to ICU.   Olanrewaju Osborn  2:39 PM 09/10/2018

## 2018-09-10 NOTE — Telephone Encounter (Signed)
Discussed with Dr. Vaught-currently biopsies pending.  As the pathology is not available yet; likely need patient will need primary surgery at a tertiary center.

## 2018-09-10 NOTE — Progress Notes (Signed)
Pt transported to CT via vent , back to room without any incident , placed back on prior settings

## 2018-09-10 NOTE — ED Notes (Signed)
FIRST NURSE NOTE: Pt here for med refill.

## 2018-09-10 NOTE — ED Notes (Signed)
Pt lethargic.  Awakens to name when called loud.  Follows commands, but then quickly drifts back to sleep.  Pt states he has not slept for days.

## 2018-09-10 NOTE — ED Triage Notes (Signed)
Pt presents today for medication refill of prednisone.

## 2018-09-10 NOTE — ED Notes (Signed)
Pt more stridorous.  Dr. Pryor Ochoa, ENT at bedside.

## 2018-09-10 NOTE — Consult Note (Signed)
Reason for Consult: Ventilator management post tracheostomy Referring Physician: Dr. Cathie Beams Alan Chung is an 52 y.o. male.  HPI: [name] is a 52 year old gentleman with a past medical history remarkable for hypertension, every day smoker, who began having symptoms of laryngitis for the past 1 to 2 months.  He was recently given a course of prednisone and antibiotics for possible laryngitis/bronchitis.  He presented to the emergency department today complaining of shortness of breath stating that the air is getting stuck in his throat.  On physical exam he was stridorous.  Dr. Pryor Ochoa saw him emergently in the emergency department.  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  Past Medical History:  Diagnosis Date  . Hypertension     Past Surgical History:  Procedure Laterality Date  . right ankle surgery      History reviewed. No pertinent family history.  Social History:  reports that he has been smoking. He has never used smokeless tobacco. He reports that he does not drink alcohol or use drugs.  Allergies:  Allergies  Allergen Reactions  . Tramadol Itching    Medications: I have reviewed the patient's current medications.  Results for orders placed or performed during the hospital encounter of 09/10/18 (from the past 48 hour(s))  Comprehensive metabolic panel     Status: Abnormal   Collection Time: 09/10/18 10:46 AM  Result Value Ref Range   Sodium 137 135 - 145 mmol/L   Potassium 3.7 3.5 - 5.1 mmol/L   Chloride 98 98 - 111 mmol/L   CO2 32 22 - 32 mmol/L   Glucose, Bld 119 (H) 70 - 99 mg/dL   BUN 10 6 - 20 mg/dL   Creatinine, Ser 0.73 0.61 - 1.24 mg/dL   Calcium 8.9 8.9 - 10.3 mg/dL   Total Protein 8.1 6.5 - 8.1 g/dL   Albumin 4.0 3.5 - 5.0 g/dL   AST 22 15 - 41 U/L   ALT 19 0 - 44 U/L   Alkaline Phosphatase 58 38 - 126 U/L   Total Bilirubin 0.5 0.3 - 1.2 mg/dL   GFR calc non Af Amer >60 >60 mL/min   GFR calc Af Amer >60 >60  mL/min   Anion gap 7 5 - 15    Comment: Performed at [facility], [address] [place], [place] [address]  CBC with Differential     Status: None   Collection Time: 09/10/18 10:46 AM  Result Value Ref Range   WBC 9.2 4.0 - 10.5 K/uL   RBC 5.19 4.22 - 5.81 MIL/uL   Hemoglobin 14.6 13.0 - 17.0 g/dL   HCT 45.2 39.0 - 52.0 %   MCV 87.1 80.0 - 100.0 fL   MCH 28.1 26.0 - 34.0 pg   MCHC 32.3 30.0 - 36.0 g/dL   RDW 12.8 11.5 - 15.5 %   Platelets 227 150 - 400 K/uL   nRBC 0.0 0.0 - 0.2 %   Neutrophils Relative % 75 %   Neutro Abs 6.9 1.7 - 7.7 K/uL   Lymphocytes Relative 15 %   Lymphs Abs 1.4 0.7 - 4.0 K/uL   Monocytes Relative 7 %   Monocytes Absolute 0.6 0.1 - 1.0 K/uL   Eosinophils Relative 2 %   Eosinophils Absolute 0.1 0.0 - 0.5 K/uL   Basophils Relative 1 %   Basophils Absolute 0.1 0.0 - 0.1 K/uL   Immature Granulocytes 0 %   Abs Immature Granulocytes 0.04 0.00 - 0.07 K/uL  Comment: Performed at [facility], [address] [place], [place] [address]  Troponin I - ONCE - STAT     Status: None   Collection Time: 09/10/18 10:46 AM  Result Value Ref Range   Troponin I <0.03 <0.03 ng/mL    Comment: Performed at [facility], [address] [place], [place] [address]    Dg Chest 2 View  Result Date: 09/10/2018 CLINICAL DATA:  Patient with shortness of breath EXAM: CHEST - 2 VIEW COMPARISON:  Chest radiograph 08/23/2018 FINDINGS: The heart size and mediastinal contours are within normal limits. Both lungs are clear. The visualized skeletal structures are unremarkable. IMPRESSION: No active cardiopulmonary disease. Electronically Signed   By: Lovey Newcomer M.D.   On: 09/10/2018 10:48   Ct Soft Tissue Neck W Contrast  Result Date: 09/10/2018 CLINICAL DATA:  52 year old male with laryngitis symptoms for the past 1-2 months and 20 lb unintentional weight loss. EXAM: CT NECK WITH CONTRAST TECHNIQUE: Multidetector CT imaging of the neck was performed  using the standard protocol following the bolus administration of intravenous contrast. CONTRAST:  1mL OMNIPAQUE IOHEXOL 300 MG/ML  SOLN COMPARISON:  Face CT 04/10/2014. FINDINGS: This exam is moderately to severely motion degraded throughout. I asked for the study to be repeated with a smaller additional contrast bolus, but the emergency department has declined this for now, the patient is being seen by ENT and possibly being intubated. Pharynx and larynx: The larynx detail is highly degraded by motion. There is abnormal bilateral laryngeal soft tissue thickening ranging from 15-25 millimeters and most pronounced at the anterior, STIR as seen on series 2, image 67. This appears to be at the level of the false cords and AE folds, and is fairly symmetric. The epiglottis is not evaluated. The true cords on series 2, image 81 have a more normal appearance. There may be abnormal soft tissue on both sides of the thyroid cartilage, uncertain. The hypopharynx is distended with gas. The oropharynx and nasopharynx contours are within normal limits. Negative parapharyngeal and retropharyngeal spaces. Salivary glands: Limited due to motion. No sublingual space, submandibular or parotid abnormality identified. Thyroid: Limited due to motion, no abnormality identified. Lymph nodes: Limited due to motion. There is an enlarged right level IIIa lymph nodes suspected on series 2, image 68 measuring 12 millimeters short axis. Contralateral left level 3 lymphadenopathy is possible. Bilateral level 2 lymph nodes appear more normal. No cystic or necrotic node is evident. Vascular: Detail severely degraded by motion. Grossly patent major vascular structures in the neck. Limited intracranial: Limited by motion, negative visible brain parenchyma. Visualized orbits: Negative. Mastoids and visualized paranasal sinuses: Well pneumatized aside from mild to moderate left maxillary sinus mucosal thickening. Skeleton: Limited due to motion.  Multilevel degenerative changes in the cervical spine. No acute or suspicious osseous lesion identified. Upper chest: The subglottic trachea appears within normal limits. No superior mediastinal lymphadenopathy. Negative visible lung parenchyma. Other findings: Benign left occipital scalp lipoma, 39 millimeters diameter by 14 millimeters in thickness. IMPRESSION: 1. Limited by motion artifact throughout, severely so at the hypopharynx and supraglottic larynx. Patient unavailable for repeat imaging at this time per the ED. A repeat Neck CT (IV contrast preferred) is suggested when possible for improved imaging accuracy. 2. 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 Possible involvement of the bilateral thyroid cartilage. 3. Hypopharynx distended with gas raising the possibility of airway stenosis. 4. Suspected  malignant right level IIIa lymph node, 12 mm short axis on series 2, image 68. Electronically Signed   By: Genevie Ann M.D.   On: 09/10/2018 13:37    ROS Per Chart Constitutional: No fever/chills Eyes: No visual changes. ENT: Laryngitis for several months Cardiovascular: Denies chest pain. Respiratory: Denies shortness of breath. Gastrointestinal: No abdominal pain.  No nausea, no vomiting.  No diarrhea.  No constipation. Genitourinary: Negative for dysuria. Musculoskeletal: Negative for back pain. Skin: Negative for rash. Neurological: Negative for headaches, focal weakness   Blood pressure (!) 190/89, pulse (!) 58, temperature 98.7 F (37.1 C), temperature source Oral, resp. rate (!) 21, SpO2 100 %. Physical Exam  Patient returns from the operating room, is agitated and thrashing around Vital signs: Please see the above listed vital signs HEENT: Patient with tracheostomy in place, blood noted Cardiovascular: Regular rate and rhythm Pulmonary: Coarse expiratory  rhonchi appreciated bilaterally Abdominal: Positive bowel sounds, soft exam Extremities: Patient with prior surgery on right lower extremity.  Some asymmetric swelling left greater than right Neurologic: Patient is moving all extremities, unable to cooperate with exam at this time   Assessment/Plan:  Acute upper airway obstruction.  Patient status post tracheostomy.  Place on mechanical ventilation protocol, propofol, post tracheostomy chest x-ray.  Patient with some evidence of bronchospasm on exam, will place on Solu-Medrol, bronchodilators.  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  Will obtain consultation with oncology, patient will need full staging.  Status post biopsy pending pathologic results.  Asymmetric lower extremity edema.  Will obtain Doppler of lower extremities rule out DVT  Eldrige Pitkin 09/10/2018, 2:48 PM

## 2018-09-10 NOTE — Anesthesia Post-op Follow-up Note (Signed)
Anesthesia QCDR form completed.        

## 2018-09-10 NOTE — ED Notes (Addendum)
Pt decreased heart rate to 35-40 then increased to 60-ish on his own.  Pt awakened to name and pat on shoulder.  Dr. Cinda Quest made aware.  Zoll pads placed on pt.

## 2018-09-10 NOTE — Anesthesia Preprocedure Evaluation (Addendum)
Anesthesia Evaluation  Patient identified by MRN, date of birth, ID band Patient awake    Reviewed: Unable to perform ROS - Chart review only  Airway Mallampati: III     Mouth opening: Limited Mouth Opening Comment: Pt with epiglottic mass. Dental   Pulmonary Current Smoker,           Cardiovascular hypertension, Pt. on medications      Neuro/Psych    GI/Hepatic   Endo/Other    Renal/GU      Musculoskeletal   Abdominal   Peds  Hematology   Anesthesia Other Findings   Reproductive/Obstetrics                            Anesthesia Physical Anesthesia Plan  ASA: II and emergent  Anesthesia Plan: MAC and General   Post-op Pain Management:    Induction: Intravenous  PONV Risk Score and Plan:   Airway Management Planned:   Additional Equipment:   Intra-op Plan:   Post-operative Plan: Post-operative intubation/ventilation  Informed Consent:   Plan Discussed with:   Anesthesia Plan Comments:         Anesthesia Quick Evaluation

## 2018-09-10 NOTE — ED Provider Notes (Addendum)
[facility] Emergency Department Provider Note    ____________________________________________   First MD Initiated Contact with Patient 09/10/18 1006     (approximate)  I have reviewed the triage vital signs and the nursing notes.   HISTORY  Chief Complaint Shortness of Breath    HPI Alan Chung is a 52 y.o. male who smokes who complains of shortness of breath.  He says he has trouble breathing because the air is getting stuck in his throat.  He has had laryngitis for the last month or 2.  He was seen on 18 November given antibiotics and steroids for possible bronchitis.  He comes in today with stridor.  He has a small less than 1 cm node in the left side of his neck which is mobile and nontender  Past Medical History:  Diagnosis Date  . Hypertension     Patient Active Problem List   Diagnosis Date Noted  . Tracheostomy, acute management ([place]) 09/10/2018  . Acute respiratory failure ([place]) 09/10/2018    Past Surgical History:  Procedure Laterality Date  . right ankle surgery      Prior to Admission medications   Medication Sig Start Date End Date Taking? Authorizing Provider  albuterol (PROVENTIL HFA;VENTOLIN HFA) 108 (90 Base) MCG/ACT inhaler Inhale 2 puffs into the lungs every 6 (six) hours as needed for wheezing or shortness of breath. 08/24/18   Lannie Fields, PA-C  ibuprofen (ADVIL,MOTRIN) 800 MG tablet Take 800 mg by mouth every 8 (eight) hours as needed.    [provider]  predniSONE (DELTASONE) 50 MG tablet Take one 50 mg tablet once daily for the next five days. 08/24/18   Lannie Fields, PA-C    Allergies Tramadol  History reviewed. No pertinent family history.  Social History Social History   Tobacco Use  . Smoking status: Current Every Day Smoker  . Smokeless tobacco: Never Used  Substance Use Topics  . Alcohol use: No  . Drug use: No    Review of Systems  Constitutional: No fever/chills Eyes: No  visual changes. ENT: Laryngitis for several months Cardiovascular: Denies chest pain. Respiratory: Denies shortness of breath. Gastrointestinal: No abdominal pain.  No nausea, no vomiting.  No diarrhea.  No constipation. Genitourinary: Negative for dysuria. Musculoskeletal: Negative for back pain. Skin: Negative for rash. Neurological: Negative for headaches, focal weakness   ____________________________________________   PHYSICAL EXAM:  VITAL SIGNS: ED Triage Vitals [09/10/18 0921]  Enc Vitals Group     BP (!) 145/75     Pulse Rate 75     Resp      Temp 98.7 F (37.1 C)     Temp Source Oral     SpO2 99 %     Weight      Height      Head Circumference      Peak Flow      Pain Score      Pain Loc      Pain Edu?      Excl. in [place]?     Constitutional: Alert and oriented.  Looks sleepy.  His brother says he can sleep well at night.  He can lie down however.  Patient has audible stridor and is breathing hard his O2 sats are good though Eyes: Conjunctivae are normal. Head: Atraumatic. Nose: No congestion/rhinnorhea. Mouth/Throat: Mucous membranes are moist.  Oropharynx non-erythematous. Neck:  stridor.  No tenderness Hematological/Lymphatic/Immunilogical:  cervical lymphadenopathy as described in HPI. *Cardiovascular: Normal rate, regular rhythm. Grossly  normal heart sounds.  Good peripheral circulation. Respiratory: Normal respiratory effort.  No retractions. Lungs CTAB. Gastrointestinal: Soft and nontender. No distention. No abdominal bruits Musculoskeletal: No lower extremity tenderness 1+ bilateral edema.   Neurologic:  Normal speech and language. No gross focal neurologic deficits are appreciated. Skin:  Skin is warm, dry and intact. No rash noted. Psychiatric: Mood and affect are normal. Speech and behavior are normal.  ____________________________________________   LABS (all labs ordered are listed, but only abnormal results are displayed)  Labs Reviewed    COMPREHENSIVE METABOLIC PANEL - Abnormal; Notable for the following components:      Result Value   Glucose, Bld 119 (*)    All other components within normal limits  GLUCOSE, CAPILLARY - Abnormal; Notable for the following components:   Glucose-Capillary 186 (*)    All other components within normal limits  BLOOD GAS, ARTERIAL - Abnormal; Notable for the following components:   pCO2 arterial 55 (*)    Bicarbonate 31.1 (*)    Acid-Base Excess 4.2 (*)    Allens test (pass/fail) POSITIVE (*)    All other components within normal limits  MRSA PCR SCREENING  CBC WITH DIFFERENTIAL/PLATELET  TROPONIN I  TRIGLYCERIDES  SURGICAL PATHOLOGY   ____________________________________________  EKG EKG read interpreted by me shows normal sinus rhythm at 69 normal axis no acute ST-T wave changes  ____________________________________________  RADIOLOGY  ED MD interpretation: CT reviewed by me there is movement radiology does not radiate.  Chest x-ray looks normal per radiology I reviewed the films  Official radiology report(s): Dg Chest 2 View  Result Date: 09/10/2018 CLINICAL DATA:  Patient with shortness of breath EXAM: CHEST - 2 VIEW COMPARISON:  Chest radiograph 08/23/2018 FINDINGS: The heart size and mediastinal contours are within normal limits. Both lungs are clear. The visualized skeletal structures are unremarkable. IMPRESSION: No active cardiopulmonary disease. Electronically Signed   By: Lovey Newcomer M.D.   On: 09/10/2018 10:48   Ct Soft Tissue Neck W Contrast  Result Date: 09/10/2018 CLINICAL DATA:  52 year old male with laryngitis symptoms for the past 1-2 months and 20 lb unintentional weight loss. EXAM: CT NECK WITH CONTRAST TECHNIQUE: Multidetector CT imaging of the neck was performed using the standard protocol following the bolus administration of intravenous contrast. CONTRAST:  63mL OMNIPAQUE IOHEXOL 300 MG/ML  SOLN COMPARISON:  Face CT 04/10/2014. FINDINGS: This exam is  moderately to severely motion degraded throughout. I asked for the study to be repeated with a smaller additional contrast bolus, but the emergency department has declined this for now, the patient is being seen by ENT and possibly being intubated. Pharynx and larynx: The larynx detail is highly degraded by motion. There is abnormal bilateral laryngeal soft tissue thickening ranging from 15-25 millimeters and most pronounced at the anterior, STIR as seen on series 2, image 67. This appears to be at the level of the false cords and AE folds, and is fairly symmetric. The epiglottis is not evaluated. The true cords on series 2, image 81 have a more normal appearance. There may be abnormal soft tissue on both sides of the thyroid cartilage, uncertain. The hypopharynx is distended with gas. The oropharynx and nasopharynx contours are within normal limits. Negative parapharyngeal and retropharyngeal spaces. Salivary glands: Limited due to motion. No sublingual space, submandibular or parotid abnormality identified. Thyroid: Limited due to motion, no abnormality identified. Lymph nodes: Limited due to motion. There is an enlarged right level IIIa lymph nodes suspected on series 2, image 68 measuring  12 millimeters short axis. Contralateral left level 3 lymphadenopathy is possible. Bilateral level 2 lymph nodes appear more normal. No cystic or necrotic node is evident. Vascular: Detail severely degraded by motion. Grossly patent major vascular structures in the neck. Limited intracranial: Limited by motion, negative visible brain parenchyma. Visualized orbits: Negative. Mastoids and visualized paranasal sinuses: Well pneumatized aside from mild to moderate left maxillary sinus mucosal thickening. Skeleton: Limited due to motion. Multilevel degenerative changes in the cervical spine. No acute or suspicious osseous lesion identified. Upper chest: The subglottic trachea appears within normal limits. No superior mediastinal  lymphadenopathy. Negative visible lung parenchyma. Other findings: Benign left occipital scalp lipoma, 39 millimeters diameter by 14 millimeters in thickness. IMPRESSION: 1. Limited by motion artifact throughout, severely so at the hypopharynx and supraglottic larynx. Patient unavailable for repeat imaging at this time per the ED. A repeat Neck CT (IV contrast preferred) is suggested when possible for improved imaging accuracy. 2. 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 Possible involvement of the bilateral thyroid cartilage. 3. Hypopharynx distended with gas raising the possibility of airway stenosis. 4. Suspected malignant right level IIIa lymph node, 12 mm short axis on series 2, image 68. Electronically Signed   By: Genevie Ann M.D.   On: 09/10/2018 13:37   Dg Chest Port 1 View  Result Date: 09/10/2018 CLINICAL DATA:  Tracheostomy placement. EXAM: PORTABLE CHEST 1 VIEW COMPARISON:  Chest x-ray from earlier same day FINDINGS: Tracheostomy tube appears appropriately positioned in the midline with tip at the level of the clavicles. Heart size and mediastinal contours are within normal limits. Lungs are clear. No pleural effusion or pneumothorax seen. Osseous structures about the chest are unremarkable. IMPRESSION: Tracheostomy tube appears appropriately positioned in the midline. Lungs are clear. Electronically Signed   By: Franki Cabot M.D.   On: 09/10/2018 15:53    ____________________________________________   PROCEDURES  Procedure(s) performed:   Procedures  Critical Care performed:   ____________________________________________   INITIAL IMPRESSION / ASSESSMENT AND PLAN / ED COURSE  CT less than optimal picture due to movement.  I called Dr. Verdene Rio ENT who came in and scope the patient who sees a mass on his epiglottis.  There is also airway narrowing.  Dr.  Pryor Ochoa planning on taking patient surgery later.  Patient had an episode of bradycardia went down into the 30s blood pressure dropped as well as resolved spontaneously very quickly.  O2 sats did not drop.  he did not lose consciousness.      ____________________________________________   FINAL CLINICAL IMPRESSION(S) / ED DIAGNOSES  Final diagnoses:  Neck mass     ED Discharge Orders    None       Note:  This document was prepared using Dragon voice recognition software and may include unintentional dictation errors.    Nena Polio, MD 09/10/18 1313    Nena Polio, MD 09/10/18 1600

## 2018-09-10 NOTE — Consult Note (Signed)
.. Alan Chung, Alan Chung [id number] Jun 03, 1966 Alan Manner, MD  Reason for Consult: acute respiratory failure, supraglottic/laryngeal mass  HPI: 52 year old male presents to ED with several month worsening shortness of breath and inability to tolerate secretions.  Patient is limited in speaking and his brother is mostly the historian.  Reports was see in ED 11/18 and was treated for COPD and improved.  This worsened again.  Underwent attempted CT scan but had episode of bradycardia and had a hard time staying still for procedure.  Asked to evaluate airway.  Allergies:  Allergies  Allergen Reactions  . Tramadol Itching    ROS: Review of systems normal other than 12 systems except per HPI.  PMH:  Past Medical History:  Diagnosis Date  . Hypertension     FH: History reviewed. No pertinent family history.  SH:  Social History   Socioeconomic History  . Marital status: Single    Spouse name: Not on file  . Number of children: Not on file  . Years of education: Not on file  . Highest education level: Not on file  Occupational History  . Not on file  Social Needs  . Financial resource strain: Not on file  . Food insecurity:    Worry: Not on file    Inability: Not on file  . Transportation needs:    Medical: Not on file    Non-medical: Not on file  Tobacco Use  . Smoking status: Current Every Day Smoker  . Smokeless tobacco: Never Used  Substance and Sexual Activity  . Alcohol use: No  . Drug use: No  . Sexual activity: Not on file  Lifestyle  . Physical activity:    Days per week: Not on file    Minutes per session: Not on file  . Stress: Not on file  Relationships  . Social connections:    Talks on phone: Not on file    Gets together: Not on file    Attends religious service: Not on file    Active member of club or organization: Not on file    Attends meetings of clubs or organizations: Not on file    Relationship status: Not on file  . Intimate partner  violence:    Fear of current or ex partner: Not on file    Emotionally abused: Not on file    Physically abused: Not on file    Forced sexual activity: Not on file  Other Topics Concern  . Not on file  Social History Narrative  . Not on file    PSH:  Past Surgical History:  Procedure Laterality Date  . right ankle surgery      Physical  Exam:  GEN-  Somnolent male drooling out of his mouth EARS-  Clear external NOSE-  Clear anteriorly OC/OP-  Poor dentition NECK-  No LAD, thyoid normal with no masses.  Procedure:  Transnasal flexible laryngoscopy-  After verbal consent obtained from patient and brother, the patient's nasal cavity was anesthetized with gennasal and lidocaine.  A flexible laryngoscope was inserted into the patient's nasal cavity and nasopharynx.  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  This appeared fixed and the visualized portion of the patient's supraglottic airway were filled with an exophytic mass coming from the laryngeal surface of the epiglottis.   A/P: Acute respiratory failure with supraglottic/laryngeal mass.  Plan:  To OR emergently for awake tracheostomy and diagnostic laryngoscopy with biopsy.   Alan Chung 09/10/2018 2:48 PM

## 2018-09-10 NOTE — Transfer of Care (Signed)
Immediate Anesthesia Transfer of Care Note  Patient: Alan Chung  Procedure(s) Performed: TRACHEOSTOMY (N/A ) LARYNGOSCOPY and biopsies  Patient Location: PACU and ICU  Anesthesia Type:General  Level of Consciousness: sedated  Airway & Oxygen Therapy: Patient connected to tracheostomy mask oxygen  Post-op Assessment: Report given to RN  Post vital signs: stable  Last Vitals:  Vitals Value Taken Time  BP    Temp    Pulse 88 09/10/2018  3:01 PM  Resp 20 09/10/2018  3:01 PM  SpO2 99 % 09/10/2018  3:01 PM  Vitals shown include unvalidated device data.  Last Pain:  Vitals:   09/10/18 0921  TempSrc: Oral         Complications: No apparent anesthesia complications

## 2018-09-10 NOTE — ED Notes (Signed)
Pt was in triage and stating he was having trouble breathen. Pt was labored breath.  Pt is roomed at this time. Rn attempted to notify floor RN and phone busy. Charge is aware.

## 2018-09-10 NOTE — H&P (Signed)
[facility] NAME: Alan Chung    MR#:  [id number]  DATE OF BIRTH:  [birth year]  DATE OF ADMISSION:  09/10/2018  PRIMARY CARE PHYSICIAN: System, Provider Not In   REQUESTING/REFERRING PHYSICIAN: Rip Harbour  CHIEF COMPLAINT:  Shortness of breath with stridor  HISTORY OF PRESENT ILLNESS:  Asaf Elmquist  is a 52 y.o. male with a known history of essential hypertension, tobacco abuse disorder has been having worsening of shortness of breath for the past 1 month associated with inability to swallow his secretions.  Patient came into the ED on 1118 and was treated for COPD exacerbation with steroids, came into ED again to refill his steroids, ED physician was concerned as patient was short of breath with stridor.  CT scan of the soft tissue of the neck was ordered and patient became bradycardic.  ENT consult placed emergently for acute hypoxic respiratory failure with supraglottic mass, seen by Dr.Vaught and patient had emergent tracheostomy and placed on ventilator.  No family members at bedside during my examination patient was seen in intensive care unit ,according to the ICU nurse brother just left after talking to the intensivist  PAST MEDICAL HISTORY:   Past Medical History:  Diagnosis Date  . Hypertension     PAST SURGICAL HISTOIRY:   Past Surgical History:  Procedure Laterality Date  . right ankle surgery      SOCIAL HISTORY:   Social History   Tobacco Use  . Smoking status: Current Every Day Smoker  . Smokeless tobacco: Never Used  Substance Use Topics  . Alcohol use: No    FAMILY HISTORY:  History reviewed. No pertinent family history.  DRUG ALLERGIES:   Allergies  Allergen Reactions  . Tramadol Itching    REVIEW OF SYSTEMS:  Review of system unobtainable as the patient is sedated  MEDICATIONS AT HOME:   Prior to Admission medications   Medication Sig Start Date End Date Taking? Authorizing  Provider  albuterol (PROVENTIL HFA;VENTOLIN HFA) 108 (90 Base) MCG/ACT inhaler Inhale 2 puffs into the lungs every 6 (six) hours as needed for wheezing or shortness of breath. 08/24/18   Lannie Fields, PA-C  ibuprofen (ADVIL,MOTRIN) 800 MG tablet Take 800 mg by mouth every 8 (eight) hours as needed.    [provider]  predniSONE (DELTASONE) 50 MG tablet Take one 50 mg tablet once daily for the next five days. 08/24/18   Lannie Fields, PA-C      VITAL SIGNS:  Blood pressure (!) 190/89, pulse 92, temperature 98.1 F (36.7 C), temperature source Axillary, resp. rate (!) 29, height 5\' 8"  (1.727 m), weight 96.2 kg, SpO2 99 %.  PHYSICAL EXAMINATION:  GENERAL:  52 y.o.-year-old patient lying in the bed with no acute distress.  EYES: Pupils equal, round, reactive to light and accommodation. No scleral icterus. Extraocular muscles intact.  HEENT: Head atraumatic, normocephalic.  Tracheostomy connected to ventilator NECK:  Supple, no jugular venous distention.  Tracheostomy LUNGS: Normal breath sounds bilaterally, no wheezing, rales,rhonchi or crepitation. No use of accessory muscles of respiration.  CARDIOVASCULAR: S1, S2 normal. No murmurs, rubs, or gallops.  ABDOMEN: Soft, nontender, nondistended. Bowel sounds present.   EXTREMITIES: Edematous ,no cyanosis, or clubbing.  NEUROLOGIC: Patient is sedated, responding to pain  Gait not checked.  PSYCHIATRIC: The patient is sedated SKIN: No obvious rash, lesion, or ulcer.   LABORATORY PANEL:   CBC Recent Labs  Lab 09/10/18 1046  WBC 9.2  HGB  14.6  HCT 45.2  PLT 227   ------------------------------------------------------------------------------------------------------------------  Chemistries  Recent Labs  Lab 09/10/18 1046  NA 137  K 3.7  CL 98  CO2 32  GLUCOSE 119*  BUN 10  CREATININE 0.73  CALCIUM 8.9  AST 22  ALT 19  ALKPHOS 58  BILITOT 0.5    ------------------------------------------------------------------------------------------------------------------  Cardiac Enzymes Recent Labs  Lab 09/10/18 1046  TROPONINI <0.03   ------------------------------------------------------------------------------------------------------------------  RADIOLOGY:  Dg Chest 2 View  Result Date: 09/10/2018 CLINICAL DATA:  Patient with shortness of breath EXAM: CHEST - 2 VIEW COMPARISON:  Chest radiograph 08/23/2018 FINDINGS: The heart size and mediastinal contours are within normal limits. Both lungs are clear. The visualized skeletal structures are unremarkable. IMPRESSION: No active cardiopulmonary disease. Electronically Signed   By: Lovey Newcomer M.D.   On: 09/10/2018 10:48   Ct Soft Tissue Neck W Contrast  Result Date: 09/10/2018 CLINICAL DATA:  52 year old male with laryngitis symptoms for the past 1-2 months and 20 lb unintentional weight loss. EXAM: CT NECK WITH CONTRAST TECHNIQUE: Multidetector CT imaging of the neck was performed using the standard protocol following the bolus administration of intravenous contrast. CONTRAST:  27mL OMNIPAQUE IOHEXOL 300 MG/ML  SOLN COMPARISON:  Face CT 04/10/2014. FINDINGS: This exam is moderately to severely motion degraded throughout. I asked for the study to be repeated with a smaller additional contrast bolus, but the emergency department has declined this for now, the patient is being seen by ENT and possibly being intubated. Pharynx and larynx: The larynx detail is highly degraded by motion. There is abnormal bilateral laryngeal soft tissue thickening ranging from 15-25 millimeters and most pronounced at the anterior, STIR as seen on series 2, image 67. This appears to be at the level of the false cords and AE folds, and is fairly symmetric. The epiglottis is not evaluated. The true cords on series 2, image 81 have a more normal appearance. There may be abnormal soft tissue on both sides of the thyroid  cartilage, uncertain. The hypopharynx is distended with gas. The oropharynx and nasopharynx contours are within normal limits. Negative parapharyngeal and retropharyngeal spaces. Salivary glands: Limited due to motion. No sublingual space, submandibular or parotid abnormality identified. Thyroid: Limited due to motion, no abnormality identified. Lymph nodes: Limited due to motion. There is an enlarged right level IIIa lymph nodes suspected on series 2, image 68 measuring 12 millimeters short axis. Contralateral left level 3 lymphadenopathy is possible. Bilateral level 2 lymph nodes appear more normal. No cystic or necrotic node is evident. Vascular: Detail severely degraded by motion. Grossly patent major vascular structures in the neck. Limited intracranial: Limited by motion, negative visible brain parenchyma. Visualized orbits: Negative. Mastoids and visualized paranasal sinuses: Well pneumatized aside from mild to moderate left maxillary sinus mucosal thickening. Skeleton: Limited due to motion. Multilevel degenerative changes in the cervical spine. No acute or suspicious osseous lesion identified. Upper chest: The subglottic trachea appears within normal limits. No superior mediastinal lymphadenopathy. Negative visible lung parenchyma. Other findings: Benign left occipital scalp lipoma, 39 millimeters diameter by 14 millimeters in thickness. IMPRESSION: 1. Limited by motion artifact throughout, severely so at the hypopharynx and supraglottic larynx. Patient unavailable for repeat imaging at this time per the ED. A repeat Neck CT (IV contrast preferred) is suggested when possible for improved imaging accuracy. 2. 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 Possible involvement of the bilateral thyroid cartilage. 3.  Hypopharynx distended with gas raising the possibility of airway  stenosis. 4. Suspected malignant right level IIIa lymph node, 12 mm short axis on series 2, image 68. Electronically Signed   By: Genevie Ann M.D.   On: 09/10/2018 13:37   Dg Chest Port 1 View  Result Date: 09/10/2018 CLINICAL DATA:  Tracheostomy placement. EXAM: PORTABLE CHEST 1 VIEW COMPARISON:  Chest x-ray from earlier same day FINDINGS: Tracheostomy tube appears appropriately positioned in the midline with tip at the level of the clavicles. Heart size and mediastinal contours are within normal limits. Lungs are clear. No pleural effusion or pneumothorax seen. Osseous structures about the chest are unremarkable. IMPRESSION: Tracheostomy tube appears appropriately positioned in the midline. Lungs are clear. Electronically Signed   By: Franki Cabot M.D.   On: 09/10/2018 15:53    EKG:  No orders found for this or any previous visit.  IMPRESSION AND PLAN:   Justino Boze  is a 52 y.o. male with a known history of essential hypertension, tobacco abuse disorder has been having worsening of shortness of breath for the past 1 month associated with inability to swallow his secretions.  Patient came into the ED on 1118 and was treated for COPD exacerbation with steroids, came into ED again to refill his steroids, ED physician was concerned as patient was short of breath with stridor.  CT scan of the soft tissue of the neck was ordered and patient became bradycardic.  ENT consult placed emergently for acute hypoxic respiratory failure with supraglottic mass, seen by Dr.Vaught and patient had emergent tracheostomy and placed on ventilator.  #Acute hypoxic respiratory failure secondary to acute upper airway obstruction causing stridor Admit to intensive care unit Patient got emergency tracheostomy and on ventilator Seen by ENT and pulmonology Vent management by intensivist On fentanyl and propofol monitor triglycerides IV Solu-Medrol and bronchodilators for bronchospasm  #Supraglottic/laryngeal mass-the  underlying etiology for stridor Oncology consult Need staging of the supraglottic mass   #History of COPD with laryngitis Bronchodilators and steroids  #Asymmetric lower extremity edema   Dopplers to rule out DVT  #Hypertension IV Lopressor as needed  #Tobacco abuse disorder Currently patient is sedated  All the records are reviewed and case discussed with ED provider. Management plans discussed with the patient, family and they are in agreement.  CODE STATUS:FC   TOTAL critical care  TIME TAKING CARE OF THIS PATIENT: 42  minutes.   Note: This dictation was prepared with Dragon dictation along with smaller phrase technology. Any transcriptional errors that result from this process are unintentional.  Nicholes Mango M.D on 09/10/2018 at 3:58 PM  Between 7am to 6pm - Pager - [phone]  After 6pm go to [email or web address] - password [email or web address]  [facility]  Office  [phone]  CC: Primary care physician; System, Provider Not In

## 2018-09-10 NOTE — ED Notes (Signed)
Dr. Pryor Ochoa at bedside.

## 2018-09-11 ENCOUNTER — Encounter: Payer: Self-pay | Admitting: Internal Medicine

## 2018-09-11 DIAGNOSIS — R221 Localized swelling, mass and lump, neck: Secondary | ICD-10-CM

## 2018-09-11 DIAGNOSIS — Z9911 Dependence on respirator [ventilator] status: Secondary | ICD-10-CM

## 2018-09-11 DIAGNOSIS — J9600 Acute respiratory failure, unspecified whether with hypoxia or hypercapnia: Secondary | ICD-10-CM

## 2018-09-11 DIAGNOSIS — Z930 Tracheostomy status: Secondary | ICD-10-CM

## 2018-09-11 DIAGNOSIS — J387 Other diseases of larynx: Secondary | ICD-10-CM

## 2018-09-11 DIAGNOSIS — F17210 Nicotine dependence, cigarettes, uncomplicated: Secondary | ICD-10-CM

## 2018-09-11 LAB — CBC
HCT: 43.1 % (ref 39.0–52.0)
Hemoglobin: 13.8 g/dL (ref 13.0–17.0)
MCH: 28.2 pg (ref 26.0–34.0)
MCHC: 32 g/dL (ref 30.0–36.0)
MCV: 88 fL (ref 80.0–100.0)
Platelets: 239 10*3/uL (ref 150–400)
RBC: 4.9 MIL/uL (ref 4.22–5.81)
RDW: 12.9 % (ref 11.5–15.5)
WBC: 10.8 10*3/uL — ABNORMAL HIGH (ref 4.0–10.5)
nRBC: 0 % (ref 0.0–0.2)

## 2018-09-11 LAB — COMPREHENSIVE METABOLIC PANEL
ALK PHOS: 47 U/L (ref 38–126)
ALT: 15 U/L (ref 0–44)
AST: 16 U/L (ref 15–41)
Albumin: 3.2 g/dL — ABNORMAL LOW (ref 3.5–5.0)
Anion gap: 8 (ref 5–15)
BUN: 11 mg/dL (ref 6–20)
CALCIUM: 8.7 mg/dL — AB (ref 8.9–10.3)
CO2: 29 mmol/L (ref 22–32)
Chloride: 101 mmol/L (ref 98–111)
Creatinine, Ser: 0.66 mg/dL (ref 0.61–1.24)
GFR calc Af Amer: >60 mL/min (ref >60)
GFR calc non Af Amer: >60 mL/min (ref >60)
Glucose, Bld: 163 mg/dL — ABNORMAL HIGH (ref 70–99)
Potassium: 3.8 mmol/L (ref 3.5–5.1)
Sodium: 138 mmol/L (ref 135–145)
Total Bilirubin: 0.3 mg/dL (ref 0.3–1.2)
Total Protein: 6.8 g/dL (ref 6.5–8.1)

## 2018-09-11 LAB — MAGNESIUM: Magnesium: 1.9 mg/dL (ref 1.7–2.4)

## 2018-09-11 LAB — PHOSPHORUS: Phosphorus: 4.6 mg/dL (ref 2.5–4.6)

## 2018-09-11 NOTE — Consult Note (Signed)
[facility] CONSULT NOTE  Patient Care Team: System, Provider Not In as PCP - General  CHIEF COMPLAINTS/PURPOSE OF CONSULTATION:  Laryngeal mass  HISTORY OF PRESENTING ILLNESS: Please note patient has a tracheostomy/ventilator support Terrelle Ruffolo 52 y.o.  male long-standing history of smoking is currently admitted the hospital for progressive worsening shortness of breath.  On admission to the emergency room patient had a CT scan that showed large laryngeal mass with supraglottic extension -for which patient had emergency tracheostomy the OR.  Patient was noted to have exophytic mass extending from left base of tongue/epiglottis onto supraglottis bilaterally and involving both true vocal cords and extending to laryngeal surface of the posterior commissure.  Patient is currently in the ICU.  Patient's brother is by the bedside.  Patient has been overall stable in the ICU.  No acute events noted overnight.   Review of Systems  Unable to perform ROS: Patient nonverbal     MEDICAL HISTORY:  Past Medical History:  Diagnosis Date  . Hypertension     SURGICAL HISTORY: Past Surgical History:  Procedure Laterality Date  . right ankle surgery      SOCIAL HISTORY: Social History   Socioeconomic History  . Marital status: Single    Spouse name: Not on file  . Number of children: Not on file  . Years of education: Not on file  . Highest education level: Not on file  Occupational History  . Not on file  Social Needs  . Financial resource strain: Not on file  . Food insecurity:    Worry: Not on file    Inability: Not on file  . Transportation needs:    Medical: Not on file    Non-medical: Not on file  Tobacco Use  . Smoking status: Current Every Day Smoker  . Smokeless tobacco: Never Used  Substance and Sexual Activity  . Alcohol use: No  . Drug use: No  . Sexual activity: Not on file  Lifestyle  . Physical activity:    Days per week: Not on file     Minutes per session: Not on file  . Stress: Not on file  Relationships  . Social connections:    Talks on phone: Not on file    Gets together: Not on file    Attends religious service: Not on file    Active member of club or organization: Not on file    Attends meetings of clubs or organizations: Not on file    Relationship status: Not on file  . Intimate partner violence:    Fear of current or ex partner: Not on file    Emotionally abused: Not on file    Physically abused: Not on file    Forced sexual activity: Not on file  Other Topics Concern  . Not on file  Social History Narrative   Patient lives with his brother in [place].  He works as a Animator.  Previously married; has children.  Long-standing history of smoking since teenage years.  No significant alcohol use.      #1 brother had brain tumor-died.    FAMILY HISTORY: History reviewed. No pertinent family history.  ALLERGIES:  is allergic to tramadol.  MEDICATIONS:  Current Facility-Administered Medications  Medication Dose Route Frequency Provider Last Rate Last Dose  . 0.9 %  sodium chloride infusion   Intravenous Continuous Gouru, Aruna, MD 75 mL/hr at 09/11/18 1835    . chlorhexidine gluconate (MEDLINE KIT) (PERIDEX) 0.12 % solution 15 mL  15 mL Mouth Rinse BID Gouru, Aruna, MD   15 mL at 09/11/18 0855  . enoxaparin (LOVENOX) injection 40 mg  40 mg Subcutaneous Q24H Gouru, Aruna, MD   40 mg at 09/11/18 0855  . fentaNYL (SUBLIMAZE) bolus via infusion 50 mcg  50 mcg Intravenous Q1H PRN Gouru, Aruna, MD      . fentaNYL 2551mg in NS 2570m(1060mml) infusion-PREMIX  25-400 mcg/hr Intravenous Continuous Gouru, Aruna, MD 32.5 mL/hr at 09/11/18 1225 325 mcg/hr at 09/11/18 1225  . ipratropium-albuterol (DUONEB) 0.5-2.5 (3) MG/3ML nebulizer solution 3 mL  3 mL Nebulization Q6H Gouru, Aruna, MD   3 mL at 09/11/18 1300  . MEDLINE mouth rinse  15 mL Mouth Rinse 10 times per day GouNicholes MangoD   15 mL at 09/11/18 1830  .  methylPREDNISolone sodium succinate (SOLU-MEDROL) 125 mg/2 mL injection 60 mg  60 mg Intravenous Q6H Gouru, Aruna, MD   60 mg at 09/11/18 1522  . ondansetron (ZOFRAN) tablet 4 mg  4 mg Oral Q6H PRN Gouru, Aruna, MD       Or  . ondansetron (ZOFRAN) injection 4 mg  4 mg Intravenous Q6H PRN Gouru, Aruna, MD      . pantoprazole (PROTONIX) injection 40 mg  40 mg Intravenous QHS Gouru, Aruna, MD   40 mg at 09/10/18 2255  . propofol (DIPRIVAN) 1000 MG/100ML infusion  5-80 mcg/kg/min Intravenous Titrated Gouru, Aruna, MD 17.3 mL/hr at 09/11/18 1826 29.97 mcg/kg/min at 09/11/18 1826      .  PHYSICAL EXAMINATION:  Vitals:   09/11/18 1500 09/11/18 1600  BP: 120/65 106/70  Pulse: 63 62  Resp: 14 13  Temp:  (!) 97.2 F (36.2 C)  SpO2: 99% 98%   Filed Weights   09/10/18 1509  Weight: 212 lb 1.3 oz (96.2 kg)    Physical Exam  Constitutional: He is well-developed, well-nourished, and in no distress.  Caucasian male patient.  Resting in the bed.  Accompanied by his brother.  HENT:  Head: Normocephalic and atraumatic.  Mouth/Throat: Oropharynx is clear and moist. No oropharyngeal exudate.  Tracheostomy in place  Eyes: Pupils are equal, round, and reactive to light.  Neck: Normal range of motion. Neck supple.  Cardiovascular: Normal rate and regular rhythm.  Pulmonary/Chest: No respiratory distress. He has no wheezes.  Abdominal: Soft. Bowel sounds are normal. He exhibits no distension and no mass. There is no tenderness. There is no rebound and no guarding.  Musculoskeletal: Normal range of motion. He exhibits no edema or tenderness.  Neurological:  Patient is drowsy/sedation.  No focal deficits noted.  Skin: Skin is warm.  Psychiatric: Affect normal.     LABORATORY DATA:  I have reviewed the data as listed Lab Results  Component Value Date   WBC 10.8 (H) 09/11/2018   HGB 13.8 09/11/2018   HCT 43.1 09/11/2018   MCV 88.0 09/11/2018   PLT 239 09/11/2018   Recent Labs     08/23/18 2330 09/10/18 1046 09/11/18 0426  NA 137 137 138  K 4.1 3.7 3.8  CL 100 98 101  CO2 28 32 29  GLUCOSE 101* 119* 163*  BUN _0 CREATININE 0.64 0.73 0.66  CALCIUM 9.1 8.9 8.7*  GFRNONAA >60 >60 >60  GFRAA >60 >60 >60  PROT 7.9 8.1 6.8  ALBUMIN 3.8 4.0 3.2*  AST _1 ALT _2 ALKPHOS 60 58 47  BILITOT 0.5 0.5 0.3    RADIOGRAPHIC STUDIES: I have personally  reviewed the radiological images as listed and agreed with the findings in the report. Dg Chest 2 View  Result Date: 09/10/2018 CLINICAL DATA:  Patient with shortness of breath EXAM: CHEST - 2 VIEW COMPARISON:  Chest radiograph 08/23/2018 FINDINGS: The heart size and mediastinal contours are within normal limits. Both lungs are clear. The visualized skeletal structures are unremarkable. IMPRESSION: No active cardiopulmonary disease. Electronically Signed   By: Lovey Newcomer M.D.   On: 09/10/2018 10:48   Dg Chest 2 View  Result Date: 08/23/2018 CLINICAL DATA:  Sore throat for 2 months. Laryngitis. Nonproductive cough. Smoker. EXAM: CHEST - 2 VIEW COMPARISON:  None. FINDINGS: Normal heart size and pulmonary vascularity. No airspace disease or consolidation in the lungs. Blunting of the left costophrenic angle likely representing a small pleural effusion. No pneumothorax. Mediastinal contours appear intact. IMPRESSION: Small left pleural effusion. No evidence of active pulmonary disease. Electronically Signed   By: Lucienne Capers M.D.   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 EXAM: CT HEAD WITHOUT CONTRAST TECHNIQUE: Contiguous axial images were obtained from the base of the skull through the vertex without intravenous contrast. COMPARISON:  Prior neck CT from earlier the same day. FINDINGS: Brain: Cerebral volume within normal limits for age. No acute intracranial hemorrhage. No acute large vessel territory  infarct. No mass lesion, midline shift or mass effect. No hydrocephalus. No extra-axial fluid collection. Vascular: No hyperdense vessel. Skull: Scalp soft tissues demonstrate no acute finding. 4.1 cm lipoma noted at the left occipital scalp. Calvarium intact. No focal osseous lesions. Sinuses/Orbits: Globes and orbital soft tissues within normal limits. Mild scattered mucosal thickening within the ethmoidal air cells. Paranasal sinuses are otherwise clear. No mastoid effusion. Other: None. IMPRESSION: 1. No acute intracranial abnormality. No appreciable intracranial mass to suggest metastatic disease. 2. 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 Electronically Signed   By: Jeannine Boga M.D.   On: 09/10/2018 23:05   Ct Soft Tissue Neck W Contrast  Addendum Date: 09/11/2018   ADDENDUM REPORT: 09/11/2018 07:32 ADDENDUM: Repeat scanning was performed at 2216 hours. The patient has undergone tracheostomy which appears satisfactory. There is expected air/gas in the soft tissues of the upper mediastinum and neck following this procedure. There is now complete obstruction at the level the hypopharynx and supraglottic airway. There is a large mass of the inferior oropharynx and supraglottic region, possibly arising from the epiglottis. Mass measures up to 5 cm in size. The lesion is bilateral but more bulky on the left. Question erosion of the upper portion of the thyroid cartilage on the left. There is enlarged level 3 node on the right axial image 61 consistent with metastatic involvement. Enlarged level 5 nodes on the left may be involved by metastatic disease, largest node short axis dimension 11 mm image 67. IMPRESSION: IMPRESSION Repeat study performed after tracheostomy. Tracheostomy appears satisfactory. Air/gas in the soft tissue planes of the upper mediastinum and neck as expected. Large irregular supraglottic and inferior oro pharyngeal mass, possibly arising from the region of the epiglottis. Mass  measures up to 5 cm in size. More bulky on the left, but involving both sides. Questionable left superior thyroid cartilage erosion. Likely malignant adenopathy, level 3 on the right and level 5 on the left. Electronically Signed   By: Nelson Chimes M.D.   On: 09/11/2018 07:32   Result Date: 09/11/2018 CLINICAL DATA:  52 year old male with  laryngitis symptoms for the past 1-2 months and 20 lb unintentional weight loss. EXAM: CT NECK WITH CONTRAST TECHNIQUE: Multidetector CT imaging of the neck was performed using the standard protocol following the bolus administration of intravenous contrast. CONTRAST:  20m OMNIPAQUE IOHEXOL 300 MG/ML  SOLN COMPARISON:  Face CT 04/10/2014. FINDINGS: This exam is moderately to severely motion degraded throughout. I asked for the study to be repeated with a smaller additional contrast bolus, but the emergency department has declined this for now, the patient is being seen by ENT and possibly being intubated. Pharynx and larynx: The larynx detail is highly degraded by motion. There is abnormal bilateral laryngeal soft tissue thickening ranging from 15-25 millimeters and most pronounced at the anterior, STIR as seen on series 2, image 67. This appears to be at the level of the false cords and AE folds, and is fairly symmetric. The epiglottis is not evaluated. The true cords on series 2, image 81 have a more normal appearance. There may be abnormal soft tissue on both sides of the thyroid cartilage, uncertain. The hypopharynx is distended with gas. The oropharynx and nasopharynx contours are within normal limits. Negative parapharyngeal and retropharyngeal spaces. Salivary glands: Limited due to motion. No sublingual space, submandibular or parotid abnormality identified. Thyroid: Limited due to motion, no abnormality identified. Lymph nodes: Limited due to motion. There is an enlarged right level IIIa lymph nodes suspected on series 2, image 68 measuring 12 millimeters short axis.  Contralateral left level 3 lymphadenopathy is possible. Bilateral level 2 lymph nodes appear more normal. No cystic or necrotic node is evident. Vascular: Detail severely degraded by motion. Grossly patent major vascular structures in the neck. Limited intracranial: Limited by motion, negative visible brain parenchyma. Visualized orbits: Negative. Mastoids and visualized paranasal sinuses: Well pneumatized aside from mild to moderate left maxillary sinus mucosal thickening. Skeleton: Limited due to motion. Multilevel degenerative changes in the cervical spine. No acute or suspicious osseous lesion identified. Upper chest: The subglottic trachea appears within normal limits. No superior mediastinal lymphadenopathy. Negative visible lung parenchyma. Other findings: Benign left occipital scalp lipoma, 39 millimeters diameter by 14 millimeters in thickness. IMPRESSION: 1. Limited by motion artifact throughout, severely so at the hypopharynx and supraglottic larynx. Patient unavailable for repeat imaging at this time per the ED. A repeat Neck CT (IV contrast preferred) is suggested when possible for improved imaging accuracy. 2. 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 Possible involvement of the bilateral thyroid cartilage. 3. Hypopharynx distended with gas raising the possibility of airway stenosis. 4. Suspected malignant right level IIIa lymph node, 12 mm short axis on series 2, image 68. Electronically Signed: By: HGenevie AnnM.D. On: 09/10/2018 13:37   Ct Angio Chest Pe W Or Wo Contrast  Result Date: 09/10/2018 CLINICAL DATA:  52year old male with concern for laryngeal cancer presents with shortness of breath and stridor. EXAM: CT ANGIOGRAPHY CHEST CT ABDOMEN AND PELVIS WITH CONTRAST TECHNIQUE: Multidetector CT imaging of the chest was performed using the standard protocol during  bolus administration of intravenous contrast. Multiplanar CT image reconstructions and MIPs were obtained to evaluate the vascular anatomy. Multidetector CT imaging of the abdomen and pelvis was performed using the standard protocol during bolus administration of intravenous contrast. CONTRAST:  756mOMNIPAQUE IOHEXOL 350 MG/ML SOLN COMPARISON:  Chest radiograph dated 09/10/2018 and neck CT dated 09/10/2018 FINDINGS: Evaluation is limited due to streak artifact caused by patient's  arms. CTA CHEST FINDINGS Cardiovascular: There is no cardiomegaly or pericardial effusion. The thoracic aorta is unremarkable. There is no CT evidence of pulmonary embolism. Mediastinum/Nodes: No hilar or mediastinal adenopathy. The esophagus is grossly unremarkable. There is air in the upper mediastinum and lower neck which is new compared to the prior CT and likely related to recent tracheostomy placement. Lungs/Pleura: Minimal bibasilar subpleural atelectasis. No focal consolidation, pleural effusion, or pneumothorax. The central airways are patent. Musculoskeletal: No chest wall abnormality. No acute or significant osseous findings. Review of the MIP images confirms the above findings. CT ABDOMEN and PELVIS FINDINGS No intra-abdominal free air or free fluid. Hepatobiliary: No focal liver abnormality is seen. No gallstones, gallbladder wall thickening, or biliary dilatation. Pancreas: Unremarkable. No pancreatic ductal dilatation or surrounding inflammatory changes. Spleen: Normal in size without focal abnormality. Adrenals/Urinary Tract: The adrenal glands are unremarkable. Probable punctate nonobstructing left renal inferior pole calculus. There is no hydronephrosis on either side. There is symmetric enhancement and excretion of contrast by both kidneys. The visualized ureters appear unremarkable. The urinary bladder is decompressed around a Foley catheter. There is air within the bladder introduced by the Foley. Stomach/Bowel: There  is no bowel obstruction or active inflammation. Normal appendix. Vascular/Lymphatic: Moderate aortoiliac atherosclerotic disease. No portal venous gas. There is no adenopathy. Reproductive: The prostate and seminal vesicles are grossly unremarkable. No pelvic mass. Other: None Musculoskeletal: Irregularity of the lateral aspect of the right iliac wing, likely related to prior fracture or bone graft donation site. No acute osseous pathology. Review of the MIP images confirms the above findings. IMPRESSION: 1. No acute intrathoracic, abdominal, or pelvic pathology. No CT evidence of pulmonary embolism. 2. Air in the upper mediastinum and lower neck likely related to recent tracheostomy placement. Electronically Signed   By: Anner Crete M.D.   On: 09/10/2018 23:04   Ct Abdomen Pelvis W Contrast  Result Date: 09/10/2018 CLINICAL DATA:  53 year old male with concern for laryngeal cancer presents with shortness of breath and stridor. EXAM: CT ANGIOGRAPHY CHEST CT ABDOMEN AND PELVIS WITH CONTRAST TECHNIQUE: Multidetector CT imaging of the chest was performed using the standard protocol during bolus administration of intravenous contrast. Multiplanar CT image reconstructions and MIPs were obtained to evaluate the vascular anatomy. Multidetector CT imaging of the abdomen and pelvis was performed using the standard protocol during bolus administration of intravenous contrast. CONTRAST:  28m OMNIPAQUE IOHEXOL 350 MG/ML SOLN COMPARISON:  Chest radiograph dated 09/10/2018 and neck CT dated 09/10/2018 FINDINGS: Evaluation is limited due to streak artifact caused by patient's arms. CTA CHEST FINDINGS Cardiovascular: There is no cardiomegaly or pericardial effusion. The thoracic aorta is unremarkable. There is no CT evidence of pulmonary embolism. Mediastinum/Nodes: No hilar or mediastinal adenopathy. The esophagus is grossly unremarkable. There is air in the upper mediastinum and lower neck which is new compared to the  prior CT and likely related to recent tracheostomy placement. Lungs/Pleura: Minimal bibasilar subpleural atelectasis. No focal consolidation, pleural effusion, or pneumothorax. The central airways are patent. Musculoskeletal: No chest wall abnormality. No acute or significant osseous findings. Review of the MIP images confirms the above findings. CT ABDOMEN and PELVIS FINDINGS No intra-abdominal free air or free fluid. Hepatobiliary: No focal liver abnormality is seen. No gallstones, gallbladder wall thickening, or biliary dilatation. Pancreas: Unremarkable. No pancreatic ductal dilatation or surrounding inflammatory changes. Spleen: Normal in size without focal abnormality. Adrenals/Urinary Tract: The adrenal glands are unremarkable. Probable punctate nonobstructing left renal inferior pole calculus. There is no hydronephrosis on either  side. There is symmetric enhancement and excretion of contrast by both kidneys. The visualized ureters appear unremarkable. The urinary bladder is decompressed around a Foley catheter. There is air within the bladder introduced by the Foley. Stomach/Bowel: There is no bowel obstruction or active inflammation. Normal appendix. Vascular/Lymphatic: Moderate aortoiliac atherosclerotic disease. No portal venous gas. There is no adenopathy. Reproductive: The prostate and seminal vesicles are grossly unremarkable. No pelvic mass. Other: None Musculoskeletal: Irregularity of the lateral aspect of the right iliac wing, likely related to prior fracture or bone graft donation site. No acute osseous pathology. Review of the MIP images confirms the above findings. IMPRESSION: 1. No acute intrathoracic, abdominal, or pelvic pathology. No CT evidence of pulmonary embolism. 2. Air in the upper mediastinum and lower neck likely related to recent tracheostomy placement. Electronically Signed   By: Anner Crete M.D.   On: 09/10/2018 23:04   US Venous Img Lower Bilateral  Result Date:  09/10/2018 CLINICAL DATA:  Initial evaluation for lower extremity swelling, edema. EXAM: BILATERAL LOWER EXTREMITY VENOUS DOPPLER ULTRASOUND TECHNIQUE: Gray-scale sonography with graded compression, as well as color Doppler and duplex ultrasound were performed to evaluate the lower extremity deep venous systems from the level of the common femoral vein and including the common femoral, femoral, profunda femoral, popliteal and calf veins including the posterior tibial, peroneal and gastrocnemius veins when visible. The superficial great saphenous vein was also interrogated. Spectral Doppler was utilized to evaluate flow at rest and with distal augmentation maneuvers in the common femoral, femoral and popliteal veins. COMPARISON:  None. FINDINGS: RIGHT LOWER EXTREMITY Common Femoral Vein: No evidence of thrombus. Normal compressibility, respiratory phasicity and response to augmentation. Saphenofemoral Junction: No evidence of thrombus. Normal compressibility and flow on color Doppler imaging. Profunda Femoral Vein: No evidence of thrombus. Normal compressibility and flow on color Doppler imaging. Femoral Vein: No evidence of thrombus. Normal compressibility, respiratory phasicity and response to augmentation. Popliteal Vein: No evidence of thrombus. Normal compressibility, respiratory phasicity and response to augmentation. Calf Veins: No evidence of thrombus. Normal compressibility and flow on color Doppler imaging. Superficial Great Saphenous Vein: No evidence of thrombus. Normal compressibility. Venous Reflux:  None. Other Findings:  None. LEFT LOWER EXTREMITY Common Femoral Vein: No evidence of thrombus. Normal compressibility, respiratory phasicity and response to augmentation. Saphenofemoral Junction: No evidence of thrombus. Normal compressibility and flow on color Doppler imaging. Profunda Femoral Vein: No evidence of thrombus. Normal compressibility and flow on color Doppler imaging. Femoral Vein: No  evidence of thrombus. Normal compressibility, respiratory phasicity and response to augmentation. Popliteal Vein: No evidence of thrombus. Normal compressibility, respiratory phasicity and response to augmentation. Calf Veins: No evidence of thrombus. Normal compressibility and flow on color Doppler imaging. Superficial Great Saphenous Vein: No evidence of thrombus. Normal compressibility. Venous Reflux:  None. Other Findings:  None. IMPRESSION: No evidence of deep venous thrombosis. Electronically Signed   By: Jeannine Boga M.D.   On: 09/10/2018 23:12   Dg Chest Port 1 View  Result Date: 09/10/2018 CLINICAL DATA:  Tracheostomy placement. EXAM: PORTABLE CHEST 1 VIEW COMPARISON:  Chest x-ray from earlier same day FINDINGS: Tracheostomy tube appears appropriately positioned in the midline with tip at the level of the clavicles. Heart size and mediastinal contours are within normal limits. Lungs are clear. No pleural effusion or pneumothorax seen. Osseous structures about the chest are unremarkable. IMPRESSION: Tracheostomy tube appears appropriately positioned in the midline. Lungs are clear. Electronically Signed   By: Franki Cabot M.D.   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  Highly suspicious of malignancy.  Await pathology.  If positive for squamous cell carcinoma patient will need definitive treatment with surgery at a tertiary care center.  #Acute respiratory failure secondary to #1 obstruction secondary to tumor-status post tracheostomy.  Awaiting speech pathology evaluation.  # Thank you Dr.Konidena for allowing me to participate in the care of your pleasant patient. Please do not hesitate to contact me with questions or concerns in the interim.Discussed with Dr.Konidena; and Dr.Vaught.   # I reviewed the blood work- with the patient's brother in detail; also reviewed the imaging independently [as summarized above];  and with the patient's brother in detail.     All questions were answered. The patient knows to call the clinic with any problems, questions or concerns.    Cammie Sickle, MD 09/11/2018 7:23 PM

## 2018-09-11 NOTE — Assessment & Plan Note (Addendum)
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  Pathology positive for squamous cell carcinoma.    #Acute respiratory failure secondary to #1 obstruction secondary to tumor-status post tracheostomy; management as per ENT  #Dietary -awaiting PEG tube placement through IR today.  Recommendations: Patient need PET scan as outpatient.  Await [facility] recommendations regarding primary/definitive surgery.  If patient given the extent of bulky disease not a candidate for upfront surgery-induction chemotherapy would be recommended.  Discussed with Dr. Pryor Ochoa.  Imaging/pathology reviewed at the tumor conference today.  Also discussed with Dr. Brett Albino.

## 2018-09-11 NOTE — Progress Notes (Signed)
.. 09/11/2018 9:53 AM  Alan Chung [id number]  Post-Op Day 1    Temp:  [98.1 F (36.7 C)-98.8 F (37.1 C)] 98.2 F (36.8 C) (12/07 0400) Pulse Rate:  [50-92] 65 (12/07 0600) Resp:  [12-30] 12 (12/07 0600) BP: (122-207)/(68-125) 138/79 (12/07 0600) SpO2:  [93 %-100 %] 100 % (12/07 0600) FiO2 (%):  [35 %] 35 % (12/07 0825) Weight:  [96.2 kg] 96.2 kg (12/06 1509),     Intake/Output Summary (Last 24 hours) at 09/11/2018 0953 Last data filed at 09/11/2018 0600 Gross per 24 hour  Intake 1998.84 ml  Output 1005 ml  Net 993.84 ml    Results for orders placed or performed during the hospital encounter of 09/10/18 (from the past 24 hour(s))  Comprehensive metabolic panel     Status: Abnormal   Collection Time: 09/10/18 10:46 AM  Result Value Ref Range   Sodium 137 135 - 145 mmol/L   Potassium 3.7 3.5 - 5.1 mmol/L   Chloride 98 98 - 111 mmol/L   CO2 32 22 - 32 mmol/L   Glucose, Bld 119 (H) 70 - 99 mg/dL   BUN 10 6 - 20 mg/dL   Creatinine, Ser 0.73 0.61 - 1.24 mg/dL   Calcium 8.9 8.9 - 10.3 mg/dL   Total Protein 8.1 6.5 - 8.1 g/dL   Albumin 4.0 3.5 - 5.0 g/dL   AST 22 15 - 41 U/L   ALT 19 0 - 44 U/L   Alkaline Phosphatase 58 38 - 126 U/L   Total Bilirubin 0.5 0.3 - 1.2 mg/dL   GFR calc non Af Amer >60 >60 mL/min   GFR calc Af Amer >60 >60 mL/min   Anion gap 7 5 - 15  CBC with Differential     Status: None   Collection Time: 09/10/18 10:46 AM  Result Value Ref Range   WBC 9.2 4.0 - 10.5 K/uL   RBC 5.19 4.22 - 5.81 MIL/uL   Hemoglobin 14.6 13.0 - 17.0 g/dL   HCT 45.2 39.0 - 52.0 %   MCV 87.1 80.0 - 100.0 fL   MCH 28.1 26.0 - 34.0 pg   MCHC 32.3 30.0 - 36.0 g/dL   RDW 12.8 11.5 - 15.5 %   Platelets 227 150 - 400 K/uL   nRBC 0.0 0.0 - 0.2 %   Neutrophils Relative % 75 %   Neutro Abs 6.9 1.7 - 7.7 K/uL   Lymphocytes Relative 15 %   Lymphs Abs 1.4 0.7 - 4.0 K/uL   Monocytes Relative 7 %   Monocytes Absolute 0.6 0.1 - 1.0 K/uL   Eosinophils Relative 2 %   Eosinophils Absolute 0.1 0.0 - 0.5 K/uL   Basophils Relative 1 %   Basophils Absolute 0.1 0.0 - 0.1 K/uL   Immature Granulocytes 0 %   Abs Immature Granulocytes 0.04 0.00 - 0.07 K/uL  Troponin I - ONCE - STAT     Status: None   Collection Time: 09/10/18 10:46 AM  Result Value Ref Range   Troponin I <0.03 <0.03 ng/mL  Glucose, capillary     Status: Abnormal   Collection Time: 09/10/18  2:57 PM  Result Value Ref Range   Glucose-Capillary 186 (H) 70 - 99 mg/dL  MRSA PCR Screening     Status: None   Collection Time: 09/10/18  3:22 PM  Result Value Ref Range   MRSA by PCR NEGATIVE NEGATIVE  Blood gas, arterial     Status: Abnormal   Collection Time: 09/10/18  3:30 PM  Result Value Ref Range   FIO2 35.00    Mode PRESSURE REGULATED VOLUME CONTROL    VT 500 mL   LHR 15 resp/min   Peep/cpap 5.0 cm H20   pH, Arterial 7.36 7.350 - 7.450   pCO2 arterial 55 (H) 32.0 - 48.0 mmHg   pO2, Arterial 107 83.0 - 108.0 mmHg   Bicarbonate 31.1 (H) 20.0 - 28.0 mmol/L   Acid-Base Excess 4.2 (H) 0.0 - 2.0 mmol/L   O2 Saturation 97.9 %   Patient temperature 37.0    Collection site RIGHT RADIAL    Sample type ARTERIAL DRAW    Allens test (pass/fail) POSITIVE (A) PASS  Triglycerides     Status: None   Collection Time: 09/10/18 10:37 PM  Result Value Ref Range   Triglycerides 70 <150 mg/dL  Comprehensive metabolic panel     Status: Abnormal   Collection Time: 09/11/18  4:26 AM  Result Value Ref Range   Sodium 138 135 - 145 mmol/L   Potassium 3.8 3.5 - 5.1 mmol/L   Chloride 101 98 - 111 mmol/L   CO2 29 22 - 32 mmol/L   Glucose, Bld 163 (H) 70 - 99 mg/dL   BUN 11 6 - 20 mg/dL   Creatinine, Ser 0.66 0.61 - 1.24 mg/dL   Calcium 8.7 (L) 8.9 - 10.3 mg/dL   Total Protein 6.8 6.5 - 8.1 g/dL   Albumin 3.2 (L) 3.5 - 5.0 g/dL   AST 16 15 - 41 U/L   ALT 15 0 - 44 U/L   Alkaline Phosphatase 47 38 - 126 U/L   Total Bilirubin 0.3 0.3 - 1.2 mg/dL   GFR calc non Af Amer >60 >60 mL/min   GFR calc Af Amer  >60 >60 mL/min   Anion gap 8 5 - 15  CBC     Status: Abnormal   Collection Time: 09/11/18  4:26 AM  Result Value Ref Range   WBC 10.8 (H) 4.0 - 10.5 K/uL   RBC 4.90 4.22 - 5.81 MIL/uL   Hemoglobin 13.8 13.0 - 17.0 g/dL   HCT 43.1 39.0 - 52.0 %   MCV 88.0 80.0 - 100.0 fL   MCH 28.2 26.0 - 34.0 pg   MCHC 32.0 30.0 - 36.0 g/dL   RDW 12.9 11.5 - 15.5 %   Platelets 239 150 - 400 K/uL   nRBC 0.0 0.0 - 0.2 %  Magnesium     Status: None   Collection Time: 09/11/18  4:26 AM  Result Value Ref Range   Magnesium 1.9 1.7 - 2.4 mg/dL  Phosphorus     Status: None   Collection Time: 09/11/18  4:26 AM  Result Value Ref Range   Phosphorus 4.6 2.5 - 4.6 mg/dL    SUBJECTIVE:  No acute events over coming to ICU.  Reports improved breathing by movements.  Brother concerned regarding secretions.  Dressing has been changed.  CT scan obtained last night.   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  Speech evaluation bedside to see about tolerating PO once more awake.  Will discuss with  [facility] on Monday regarding timing of evaluation for possible laryngectomy given advanced disease.  Discussed with patient and brother that surgery will be most change of cure but chemo/xrt is an option as well.  Alan Chung 09/11/2018, 9:53 AM

## 2018-09-11 NOTE — Progress Notes (Signed)
Follow up - Critical Care Medicine Note  Patient Details:    Alan Chung is an 52 y.o. male. with a past medical history remarkable for hypertension, every day smoker, who began having symptoms of laryngitis for the past 1 to 2 months.  He was recently given a course of prednisone and antibiotics for possible laryngitis/bronchitis.  He presented to the emergency department today complaining of shortness of breath stating that the air is getting stuck in his throat.  On physical exam he was stridorous.  Dr. Pryor Ochoa saw him emergently in the emergency department.  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   Lines, Airways, Drains: Urethral Catheter Ova [place], RN Double-lumen 16 Fr. (Active)  Indication for Insertion or Continuance of Catheter Peri-operative use for selective surgical procedure 09/11/2018  4:15 AM  Site Assessment Clean;Intact 09/11/2018  4:15 AM  Catheter Maintenance Bag below level of bladder;Catheter secured;Drainage bag/tubing not touching floor;Insertion date on drainage bag;No dependent loops;Seal intact 09/11/2018  4:15 AM  Collection Container Standard drainage bag 09/11/2018  4:15 AM  Securement Method Securing device (Describe) 09/11/2018  4:15 AM  Urinary Catheter Interventions Unclamped 09/11/2018  4:15 AM    Anti-infectives:  Anti-infectives (From admission, onward)   None      Microbiology: Results for orders placed or performed during the hospital encounter of 09/10/18  MRSA PCR Screening     Status: None   Collection Time: 09/10/18  3:22 PM  Result Value Ref Range Status   MRSA by PCR NEGATIVE NEGATIVE Final    Comment:        The GeneXpert MRSA Assay (FDA approved for NASAL specimens only), is one component of a comprehensive MRSA colonization surveillance program. It is not intended to diagnose MRSA infection nor to guide or monitor treatment for MRSA infections. Performed at [facility], [address] [place], [place] [address]     Studies: Dg Chest 2 View  Result Date: 09/10/2018 CLINICAL DATA:  Patient with shortness of breath EXAM: CHEST - 2 VIEW COMPARISON:  Chest radiograph 08/23/2018 FINDINGS: The heart size and mediastinal contours are within normal limits. Both lungs are clear. The visualized skeletal structures are unremarkable. IMPRESSION: No active cardiopulmonary disease. Electronically Signed   By: Lovey Newcomer M.D.   On: 09/10/2018 10:48   Dg Chest 2 View  Result Date: 08/23/2018 CLINICAL DATA:  Sore throat for 2 months. Laryngitis. Nonproductive cough. Smoker. EXAM: CHEST - 2 VIEW COMPARISON:  None. FINDINGS: Normal heart size and pulmonary vascularity. No airspace disease or consolidation in the lungs. Blunting of the left costophrenic angle likely representing a small pleural effusion. No pneumothorax. Mediastinal contours appear intact. IMPRESSION: Small left pleural effusion. No evidence of active pulmonary disease. Electronically Signed   By: Lucienne Capers M.D.   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 EXAM: CT HEAD WITHOUT CONTRAST TECHNIQUE: Contiguous axial images were obtained from the base of the skull through the vertex without intravenous contrast. COMPARISON:  Prior neck CT from earlier the same day. FINDINGS: Brain: Cerebral volume within normal limits for age. No acute intracranial hemorrhage. No acute large vessel territory infarct. No mass lesion, midline shift or mass effect. No hydrocephalus. No extra-axial fluid collection. Vascular: No hyperdense vessel. Skull: Scalp soft tissues demonstrate no acute finding. 4.1 cm lipoma noted at the left occipital scalp. Calvarium intact. No focal osseous lesions. Sinuses/Orbits: Globes and  orbital soft tissues within normal limits. Mild scattered mucosal thickening within the ethmoidal air cells. Paranasal sinuses are otherwise  clear. No mastoid effusion. Other: None. IMPRESSION: 1. No acute intracranial abnormality. No appreciable intracranial mass to suggest metastatic disease. 2. 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 Electronically Signed   By: Jeannine Boga M.D.   On: 09/10/2018 23:05   Ct Soft Tissue Neck W Contrast  Addendum Date: 09/11/2018   ADDENDUM REPORT: 09/11/2018 07:32 ADDENDUM: Repeat scanning was performed at 2216 hours. The patient has undergone tracheostomy which appears satisfactory. There is expected air/gas in the soft tissues of the upper mediastinum and neck following this procedure. There is now complete obstruction at the level the hypopharynx and supraglottic airway. There is a large mass of the inferior oropharynx and supraglottic region, possibly arising from the epiglottis. Mass measures up to 5 cm in size. The lesion is bilateral but more bulky on the left. Question erosion of the upper portion of the thyroid cartilage on the left. There is enlarged level 3 node on the right axial image 61 consistent with metastatic involvement. Enlarged level 5 nodes on the left may be involved by metastatic disease, largest node short axis dimension 11 mm image 67. IMPRESSION: IMPRESSION Repeat study performed after tracheostomy. Tracheostomy appears satisfactory. Air/gas in the soft tissue planes of the upper mediastinum and neck as expected. Large irregular supraglottic and inferior oro pharyngeal mass, possibly arising from the region of the epiglottis. Mass measures up to 5 cm in size. More bulky on the left, but involving both sides. Questionable left superior thyroid cartilage erosion. Likely malignant adenopathy, level 3 on the right and level 5 on the left. Electronically Signed   By: Nelson Chimes M.D.   On: 09/11/2018 07:32   Result Date: 09/11/2018 CLINICAL DATA:  52 year old male with laryngitis symptoms for the past 1-2 months and 20 lb unintentional weight loss. EXAM: CT NECK WITH CONTRAST  TECHNIQUE: Multidetector CT imaging of the neck was performed using the standard protocol following the bolus administration of intravenous contrast. CONTRAST:  22mL OMNIPAQUE IOHEXOL 300 MG/ML  SOLN COMPARISON:  Face CT 04/10/2014. FINDINGS: This exam is moderately to severely motion degraded throughout. I asked for the study to be repeated with a smaller additional contrast bolus, but the emergency department has declined this for now, the patient is being seen by ENT and possibly being intubated. Pharynx and larynx: The larynx detail is highly degraded by motion. There is abnormal bilateral laryngeal soft tissue thickening ranging from 15-25 millimeters and most pronounced at the anterior, STIR as seen on series 2, image 67. This appears to be at the level of the false cords and AE folds, and is fairly symmetric. The epiglottis is not evaluated. The true cords on series 2, image 81 have a more normal appearance. There may be abnormal soft tissue on both sides of the thyroid cartilage, uncertain. The hypopharynx is distended with gas. The oropharynx and nasopharynx contours are within normal limits. Negative parapharyngeal and retropharyngeal spaces. Salivary glands: Limited due to motion. No sublingual space, submandibular or parotid abnormality identified. Thyroid: Limited due to motion, no abnormality identified. Lymph nodes: Limited due to motion. There is an enlarged right level IIIa lymph nodes suspected on series 2, image 68 measuring 12 millimeters short axis. Contralateral left level 3 lymphadenopathy is possible. Bilateral level 2 lymph nodes appear more normal. No cystic or necrotic node is evident. Vascular: Detail severely degraded by motion. Grossly patent major vascular structures  in the neck. Limited intracranial: Limited by motion, negative visible brain parenchyma. Visualized orbits: Negative. Mastoids and visualized paranasal sinuses: Well pneumatized aside from mild to moderate left maxillary  sinus mucosal thickening. Skeleton: Limited due to motion. Multilevel degenerative changes in the cervical spine. No acute or suspicious osseous lesion identified. Upper chest: The subglottic trachea appears within normal limits. No superior mediastinal lymphadenopathy. Negative visible lung parenchyma. Other findings: Benign left occipital scalp lipoma, 39 millimeters diameter by 14 millimeters in thickness. IMPRESSION: 1. Limited by motion artifact throughout, severely so at the hypopharynx and supraglottic larynx. Patient unavailable for repeat imaging at this time per the ED. A repeat Neck CT (IV contrast preferred) is suggested when possible for improved imaging accuracy. 2. 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 Possible involvement of the bilateral thyroid cartilage. 3. Hypopharynx distended with gas raising the possibility of airway stenosis. 4. Suspected malignant right level IIIa lymph node, 12 mm short axis on series 2, image 68. Electronically Signed: By: Genevie Ann M.D. On: 09/10/2018 13:37   Ct Angio Chest Pe W Or Wo Contrast  Result Date: 09/10/2018 CLINICAL DATA:  52 year old male with concern for laryngeal cancer presents with shortness of breath and stridor. EXAM: CT ANGIOGRAPHY CHEST CT ABDOMEN AND PELVIS WITH CONTRAST TECHNIQUE: Multidetector CT imaging of the chest was performed using the standard protocol during bolus administration of intravenous contrast. Multiplanar CT image reconstructions and MIPs were obtained to evaluate the vascular anatomy. Multidetector CT imaging of the abdomen and pelvis was performed using the standard protocol during bolus administration of intravenous contrast. CONTRAST:  30mL OMNIPAQUE IOHEXOL 350 MG/ML SOLN COMPARISON:  Chest radiograph dated 09/10/2018 and neck CT dated 09/10/2018 FINDINGS: Evaluation is limited due to streak  artifact caused by patient's arms. CTA CHEST FINDINGS Cardiovascular: There is no cardiomegaly or pericardial effusion. The thoracic aorta is unremarkable. There is no CT evidence of pulmonary embolism. Mediastinum/Nodes: No hilar or mediastinal adenopathy. The esophagus is grossly unremarkable. There is air in the upper mediastinum and lower neck which is new compared to the prior CT and likely related to recent tracheostomy placement. Lungs/Pleura: Minimal bibasilar subpleural atelectasis. No focal consolidation, pleural effusion, or pneumothorax. The central airways are patent. Musculoskeletal: No chest wall abnormality. No acute or significant osseous findings. Review of the MIP images confirms the above findings. CT ABDOMEN and PELVIS FINDINGS No intra-abdominal free air or free fluid. Hepatobiliary: No focal liver abnormality is seen. No gallstones, gallbladder wall thickening, or biliary dilatation. Pancreas: Unremarkable. No pancreatic ductal dilatation or surrounding inflammatory changes. Spleen: Normal in size without focal abnormality. Adrenals/Urinary Tract: The adrenal glands are unremarkable. Probable punctate nonobstructing left renal inferior pole calculus. There is no hydronephrosis on either side. There is symmetric enhancement and excretion of contrast by both kidneys. The visualized ureters appear unremarkable. The urinary bladder is decompressed around a Foley catheter. There is air within the bladder introduced by the Foley. Stomach/Bowel: There is no bowel obstruction or active inflammation. Normal appendix. Vascular/Lymphatic: Moderate aortoiliac atherosclerotic disease. No portal venous gas. There is no adenopathy. Reproductive: The prostate and seminal vesicles are grossly unremarkable. No pelvic mass. Other: None Musculoskeletal: Irregularity of the lateral aspect of the right iliac wing, likely related to prior fracture or bone graft donation site. No acute osseous pathology. Review of  the MIP images confirms the above findings. IMPRESSION: 1. No acute intrathoracic, abdominal, or pelvic pathology. No CT evidence  of pulmonary embolism. 2. Air in the upper mediastinum and lower neck likely related to recent tracheostomy placement. Electronically Signed   By: Anner Crete M.D.   On: 09/10/2018 23:04   Ct Abdomen Pelvis W Contrast  Result Date: 09/10/2018 CLINICAL DATA:  52 year old male with concern for laryngeal cancer presents with shortness of breath and stridor. EXAM: CT ANGIOGRAPHY CHEST CT ABDOMEN AND PELVIS WITH CONTRAST TECHNIQUE: Multidetector CT imaging of the chest was performed using the standard protocol during bolus administration of intravenous contrast. Multiplanar CT image reconstructions and MIPs were obtained to evaluate the vascular anatomy. Multidetector CT imaging of the abdomen and pelvis was performed using the standard protocol during bolus administration of intravenous contrast. CONTRAST:  19mL OMNIPAQUE IOHEXOL 350 MG/ML SOLN COMPARISON:  Chest radiograph dated 09/10/2018 and neck CT dated 09/10/2018 FINDINGS: Evaluation is limited due to streak artifact caused by patient's arms. CTA CHEST FINDINGS Cardiovascular: There is no cardiomegaly or pericardial effusion. The thoracic aorta is unremarkable. There is no CT evidence of pulmonary embolism. Mediastinum/Nodes: No hilar or mediastinal adenopathy. The esophagus is grossly unremarkable. There is air in the upper mediastinum and lower neck which is new compared to the prior CT and likely related to recent tracheostomy placement. Lungs/Pleura: Minimal bibasilar subpleural atelectasis. No focal consolidation, pleural effusion, or pneumothorax. The central airways are patent. Musculoskeletal: No chest wall abnormality. No acute or significant osseous findings. Review of the MIP images confirms the above findings. CT ABDOMEN and PELVIS FINDINGS No intra-abdominal free air or free fluid. Hepatobiliary: No focal liver  abnormality is seen. No gallstones, gallbladder wall thickening, or biliary dilatation. Pancreas: Unremarkable. No pancreatic ductal dilatation or surrounding inflammatory changes. Spleen: Normal in size without focal abnormality. Adrenals/Urinary Tract: The adrenal glands are unremarkable. Probable punctate nonobstructing left renal inferior pole calculus. There is no hydronephrosis on either side. There is symmetric enhancement and excretion of contrast by both kidneys. The visualized ureters appear unremarkable. The urinary bladder is decompressed around a Foley catheter. There is air within the bladder introduced by the Foley. Stomach/Bowel: There is no bowel obstruction or active inflammation. Normal appendix. Vascular/Lymphatic: Moderate aortoiliac atherosclerotic disease. No portal venous gas. There is no adenopathy. Reproductive: The prostate and seminal vesicles are grossly unremarkable. No pelvic mass. Other: None Musculoskeletal: Irregularity of the lateral aspect of the right iliac wing, likely related to prior fracture or bone graft donation site. No acute osseous pathology. Review of the MIP images confirms the above findings. IMPRESSION: 1. No acute intrathoracic, abdominal, or pelvic pathology. No CT evidence of pulmonary embolism. 2. Air in the upper mediastinum and lower neck likely related to recent tracheostomy placement. Electronically Signed   By: Anner Crete M.D.   On: 09/10/2018 23:04   US Venous Img Lower Bilateral  Result Date: 09/10/2018 CLINICAL DATA:  Initial evaluation for lower extremity swelling, edema. EXAM: BILATERAL LOWER EXTREMITY VENOUS DOPPLER ULTRASOUND TECHNIQUE: Gray-scale sonography with graded compression, as well as color Doppler and duplex ultrasound were performed to evaluate the lower extremity deep venous systems from the level of the common femoral vein and including the common femoral, femoral, profunda femoral, popliteal and calf veins including the  posterior tibial, peroneal and gastrocnemius veins when visible. The superficial great saphenous vein was also interrogated. Spectral Doppler was utilized to evaluate flow at rest and with distal augmentation maneuvers in the common femoral, femoral and popliteal veins. COMPARISON:  None. FINDINGS: RIGHT LOWER EXTREMITY Common Femoral Vein: No evidence of thrombus. Normal compressibility, respiratory phasicity and  response to augmentation. Saphenofemoral Junction: No evidence of thrombus. Normal compressibility and flow on color Doppler imaging. Profunda Femoral Vein: No evidence of thrombus. Normal compressibility and flow on color Doppler imaging. Femoral Vein: No evidence of thrombus. Normal compressibility, respiratory phasicity and response to augmentation. Popliteal Vein: No evidence of thrombus. Normal compressibility, respiratory phasicity and response to augmentation. Calf Veins: No evidence of thrombus. Normal compressibility and flow on color Doppler imaging. Superficial Great Saphenous Vein: No evidence of thrombus. Normal compressibility. Venous Reflux:  None. Other Findings:  None. LEFT LOWER EXTREMITY Common Femoral Vein: No evidence of thrombus. Normal compressibility, respiratory phasicity and response to augmentation. Saphenofemoral Junction: No evidence of thrombus. Normal compressibility and flow on color Doppler imaging. Profunda Femoral Vein: No evidence of thrombus. Normal compressibility and flow on color Doppler imaging. Femoral Vein: No evidence of thrombus. Normal compressibility, respiratory phasicity and response to augmentation. Popliteal Vein: No evidence of thrombus. Normal compressibility, respiratory phasicity and response to augmentation. Calf Veins: No evidence of thrombus. Normal compressibility and flow on color Doppler imaging. Superficial Great Saphenous Vein: No evidence of thrombus. Normal compressibility. Venous Reflux:  None. Other Findings:  None. IMPRESSION: No evidence  of deep venous thrombosis. Electronically Signed   By: Jeannine Boga M.D.   On: 09/10/2018 23:12   Dg Chest Port 1 View  Result Date: 09/10/2018 CLINICAL DATA:  Tracheostomy placement. EXAM: PORTABLE CHEST 1 VIEW COMPARISON:  Chest x-ray from earlier same day FINDINGS: Tracheostomy tube appears appropriately positioned in the midline with tip at the level of the clavicles. Heart size and mediastinal contours are within normal limits. Lungs are clear. No pleural effusion or pneumothorax seen. Osseous structures about the chest are unremarkable. IMPRESSION: Tracheostomy tube appears appropriately positioned in the midline. Lungs are clear. Electronically Signed   By: Franki Cabot M.D.   On: 09/10/2018 15:53    Consults: Treatment Team:  Pccm, Ander Gaster, MD Hermelinda Dellen, DO Cammie Sickle, MD   Subjective:    Overnight Issues: No significant issues overnight.  Patient sedated on mechanical ventilation.  CT scans obtained  Objective:  Vital signs for last 24 hours: Temp:  [98.1 F (36.7 C)-98.8 F (37.1 C)] 98.2 F (36.8 C) (12/07 0400) Pulse Rate:  [50-92] 65 (12/07 0600) Resp:  [12-30] 12 (12/07 0600) BP: (122-207)/(68-125) 138/79 (12/07 0600) SpO2:  [93 %-100 %] 100 % (12/07 0600) FiO2 (%):  [35 %] 35 % (12/07 0311) Weight:  [96.2 kg] 96.2 kg (12/06 1509)  Hemodynamic parameters for last 24 hours:    Intake/Output from previous day: 12/06 0701 - 12/07 0700 In: 1998.8 [I.V.:1998.8] Out: 1005 [Urine:1000; Blood:5]  Intake/Output this shift: No intake/output data recorded.  Vent settings for last 24 hours: Vent Mode: PRVC FiO2 (%):  [35 %] 35 % Set Rate:  [15 bmp] 15 bmp Vt Set:  [500 mL] 500 mL PEEP:  [5 cmH20] 5 cmH20 Plateau Pressure:  [11 cmH20-13 cmH20] 11 cmH20  Physical Exam:  Patient resting comfortably, on mechanical ventilation and sedation with propofol Vital signs:       Please see the above listed vital signs HEENT:           Patient  with tracheostomy in place, blood noted Cardiovascular:           Regular rate and rhythm Pulmonary:      Coarse expiratory rhonchi appreciated bilaterally Abdominal:      Positive bowel sounds, soft exam Extremities:     Patient with prior surgery on right  lower extremity.  Some asymmetric swelling left greater than right Neurologic:       Limited exam at this time  Assessment/Plan:   Acute upper airway obstruction.  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  CT scan of head chest abdomen and pelvis did not reveal any clear evidence of metastatic disease.  Patient with underlying bronchospasm was started on Solu-Medrol and bronchodilators yesterday.  Will begin decreasing sedation to wake patient up.  Will check morning chest x-ray.  Appreciate oncology and ENT patient will most likely need to be transferred to tertiary center for laryngeal resection  Asymmetric lower extremity edema.    Dopplers of the lower extremities were negative  Critical Care Total Time 35 minutes  Hermelinda Dellen, DO Paul Torpey 09/11/2018  *Care during the described time interval was provided by me and/or other providers on the critical care team.  I have reviewed this patient's available data, including medical history, events of note, physical examination and test results as part of my evaluation.

## 2018-09-11 NOTE — Progress Notes (Signed)
   09/11/18 1900  Clinical Encounter Type  Visited With Patient  Visit Type Initial;Spiritual support  Referral From Nurse  Recommendations Follow-up as needed.  Spiritual Encounters  Spiritual Needs Emotional;Prayer  Stress Factors  Patient Stress Factors Health changes   Chaplain met with the patient who was teary and upset. Chaplain provided emotional support, prayer, and presence to help calm the patient. Patient responded well to spiritual support.

## 2018-09-11 NOTE — Anesthesia Postprocedure Evaluation (Signed)
Anesthesia Post Note  Patient: Alan Chung  Procedure(s) Performed: TRACHEOSTOMY (N/A ) DIAGNOSTIC LARYNGOSCOPY WITH BIOPSY  Patient location during evaluation: SICU Anesthesia Type: General Level of consciousness: sedated Pain management: pain level controlled Vital Signs Assessment: post-procedure vital signs reviewed and stable Respiratory status: patient on ventilator - see flowsheet for VS Cardiovascular status: stable Anesthetic complications: no     Last Vitals:  Vitals:   09/11/18 0500 09/11/18 0600  BP:  138/79  Pulse: 64 65  Resp: 16 12  Temp:    SpO2: 100% 100%    Last Pain:  Vitals:   09/11/18 0400  TempSrc: Oral                 Precious Haws Harman Langhans

## 2018-09-11 NOTE — Progress Notes (Signed)
[facility] [place] NAME: Alan Chung    MR#:  [id number]  DATE OF BIRTH:  07-10-1966  SUBJECTIVE: Patient seen at bedside, had emergency tracheostomy yesterday. Patient has a primary laryngeal carcinoma, waiting for pathology results.  On full vent support with sedation.  CHIEF COMPLAINT:   Chief Complaint  Patient presents with  . Shortness of Breath    REVIEW OF SYSTEMS:   ROS Review of systems not possible because of intubation, sedation. DRUG ALLERGIES:   Allergies  Allergen Reactions  . Tramadol Itching    VITALS:  Blood pressure 106/70, pulse 62, temperature (!) 97.2 F (36.2 C), temperature source Axillary, resp. rate 13, height 5\' 8"  (1.727 m), weight 96.2 kg, SpO2 98 %.  PHYSICAL EXAMINATION:  GENERAL:  52 y.o.-year-old patient lying in the bed status , trach done, on full vent support. EYES: Pupils equal, round, reactive to light . No scleral icterus. Extraocular muscles intact.  HEENT: Head atraumatic, normocephalic. Oropharynx and nasopharynx clear.  NECK:  Supple, no jugular venous distention. No thyroid enlargement, no tenderness.  LUNGS: Normal breath sounds bilaterally, no wheezing, rales,rhonchi or crepitation. No use of accessory muscles of respiration.  CARDIOVASCULAR: S1, S2 normal. No murmurs, rubs, or gallops.  ABDOMEN: Soft, nontender, nondistended. Bowel sounds present. No organomegaly or mass.  EXTREMITIES: No pedal edema, cyanosis, or clubbing.  NEUROLOGIC: On full vent support through trach.  Sedated.  PSYCHIATRIC: The patient sedated.  SKIN: No obvious rash, lesion, or ulcer.    LABORATORY PANEL:   CBC Recent Labs  Lab 09/11/18 0426  WBC 10.8*  HGB 13.8  HCT 43.1  PLT 239   ------------------------------------------------------------------------------------------------------------------  Chemistries  Recent Labs  Lab 09/11/18 0426  NA 138  K 3.8  CL 101  CO2 29  GLUCOSE  163*  BUN 11  CREATININE 0.66  CALCIUM 8.7*  MG 1.9  AST 16  ALT 15  ALKPHOS 47  BILITOT 0.3   ------------------------------------------------------------------------------------------------------------------  Cardiac Enzymes Recent Labs  Lab 09/10/18 1046  TROPONINI <0.03   ------------------------------------------------------------------------------------------------------------------  RADIOLOGY:  Dg Chest 2 View  Result Date: 09/10/2018 CLINICAL DATA:  Patient with shortness of breath EXAM: CHEST - 2 VIEW COMPARISON:  Chest radiograph 08/23/2018 FINDINGS: The heart size and mediastinal contours are within normal limits. Both lungs are clear. The visualized skeletal structures are unremarkable. IMPRESSION: No active cardiopulmonary disease. Electronically Signed   By: Lovey Newcomer M.D.   On: 09/10/2018 10:48   Ct Head Wo Contrast  Result Date: 09/10/2018 CLINICAL DATA:  Initial evaluation for new onset supraglottic cancer. EXAM: CT HEAD WITHOUT CONTRAST TECHNIQUE: Contiguous axial images were obtained from the base of the skull through the vertex without intravenous contrast. COMPARISON:  Prior neck CT from earlier the same day. FINDINGS: Brain: Cerebral volume within normal limits for age. No acute intracranial hemorrhage. No acute large vessel territory infarct. No mass lesion, midline shift or mass effect. No hydrocephalus. No extra-axial fluid collection. Vascular: No hyperdense vessel. Skull: Scalp soft tissues demonstrate no acute finding. 4.1 cm lipoma noted at the left occipital scalp. Calvarium intact. No focal osseous lesions. Sinuses/Orbits: Globes and orbital soft tissues within normal limits. Mild scattered mucosal thickening within the ethmoidal air cells. Paranasal sinuses are otherwise clear. No mastoid effusion. Other: None. IMPRESSION: 1. No acute intracranial abnormality. No appreciable intracranial mass to suggest metastatic disease. 2. 4.1 cm lipoma at the left  occipital scalp. Electronically Signed   By: Pincus Badder.D.  On: 09/10/2018 23:05   Ct Soft Tissue Neck W Contrast  Addendum Date: 09/11/2018   ADDENDUM REPORT: 09/11/2018 07:32 ADDENDUM: Repeat scanning was performed at 2216 hours. The patient has undergone tracheostomy which appears satisfactory. There is expected air/gas in the soft tissues of the upper mediastinum and neck following this procedure. There is now complete obstruction at the level the hypopharynx and supraglottic airway. There is a large mass of the inferior oropharynx and supraglottic region, possibly arising from the epiglottis. Mass measures up to 5 cm in size. The lesion is bilateral but more bulky on the left. Question erosion of the upper portion of the thyroid cartilage on the left. There is enlarged level 3 node on the right axial image 61 consistent with metastatic involvement. Enlarged level 5 nodes on the left may be involved by metastatic disease, largest node short axis dimension 11 mm image 67. IMPRESSION: IMPRESSION Repeat study performed after tracheostomy. Tracheostomy appears satisfactory. Air/gas in the soft tissue planes of the upper mediastinum and neck as expected. Large irregular supraglottic and inferior oro pharyngeal mass, possibly arising from the region of the epiglottis. Mass measures up to 5 cm in size. More bulky on the left, but involving both sides. Questionable left superior thyroid cartilage erosion. Likely malignant adenopathy, level 3 on the right and level 5 on the left. Electronically Signed   By: Nelson Chimes M.D.   On: 09/11/2018 07:32   Result Date: 09/11/2018 CLINICAL DATA:  52 year old male with laryngitis symptoms for the past 1-2 months and 20 lb unintentional weight loss. EXAM: CT NECK WITH CONTRAST TECHNIQUE: Multidetector CT imaging of the neck was performed using the standard protocol following the bolus administration of intravenous contrast. CONTRAST:  10mL OMNIPAQUE IOHEXOL 300  MG/ML  SOLN COMPARISON:  Face CT 04/10/2014. FINDINGS: This exam is moderately to severely motion degraded throughout. I asked for the study to be repeated with a smaller additional contrast bolus, but the emergency department has declined this for now, the patient is being seen by ENT and possibly being intubated. Pharynx and larynx: The larynx detail is highly degraded by motion. There is abnormal bilateral laryngeal soft tissue thickening ranging from 15-25 millimeters and most pronounced at the anterior, STIR as seen on series 2, image 67. This appears to be at the level of the false cords and AE folds, and is fairly symmetric. The epiglottis is not evaluated. The true cords on series 2, image 81 have a more normal appearance. There may be abnormal soft tissue on both sides of the thyroid cartilage, uncertain. The hypopharynx is distended with gas. The oropharynx and nasopharynx contours are within normal limits. Negative parapharyngeal and retropharyngeal spaces. Salivary glands: Limited due to motion. No sublingual space, submandibular or parotid abnormality identified. Thyroid: Limited due to motion, no abnormality identified. Lymph nodes: Limited due to motion. There is an enlarged right level IIIa lymph nodes suspected on series 2, image 68 measuring 12 millimeters short axis. Contralateral left level 3 lymphadenopathy is possible. Bilateral level 2 lymph nodes appear more normal. No cystic or necrotic node is evident. Vascular: Detail severely degraded by motion. Grossly patent major vascular structures in the neck. Limited intracranial: Limited by motion, negative visible brain parenchyma. Visualized orbits: Negative. Mastoids and visualized paranasal sinuses: Well pneumatized aside from mild to moderate left maxillary sinus mucosal thickening. Skeleton: Limited due to motion. Multilevel degenerative changes in the cervical spine. No acute or suspicious osseous lesion identified. Upper chest: The  subglottic trachea appears within normal  limits. No superior mediastinal lymphadenopathy. Negative visible lung parenchyma. Other findings: Benign left occipital scalp lipoma, 39 millimeters diameter by 14 millimeters in thickness. IMPRESSION: 1. Limited by motion artifact throughout, severely so at the hypopharynx and supraglottic larynx. Patient unavailable for repeat imaging at this time per the ED. A repeat Neck CT (IV contrast preferred) is suggested when possible for improved imaging accuracy. 2. 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 Possible involvement of the bilateral thyroid cartilage. 3. Hypopharynx distended with gas raising the possibility of airway stenosis. 4. Suspected malignant right level IIIa lymph node, 12 mm short axis on series 2, image 68. Electronically Signed: By: Genevie Ann M.D. On: 09/10/2018 13:37   Ct Angio Chest Pe W Or Wo Contrast  Result Date: 09/10/2018 CLINICAL DATA:  52 year old male with concern for laryngeal cancer presents with shortness of breath and stridor. EXAM: CT ANGIOGRAPHY CHEST CT ABDOMEN AND PELVIS WITH CONTRAST TECHNIQUE: Multidetector CT imaging of the chest was performed using the standard protocol during bolus administration of intravenous contrast. Multiplanar CT image reconstructions and MIPs were obtained to evaluate the vascular anatomy. Multidetector CT imaging of the abdomen and pelvis was performed using the standard protocol during bolus administration of intravenous contrast. CONTRAST:  72mL OMNIPAQUE IOHEXOL 350 MG/ML SOLN COMPARISON:  Chest radiograph dated 09/10/2018 and neck CT dated 09/10/2018 FINDINGS: Evaluation is limited due to streak artifact caused by patient's arms. CTA CHEST FINDINGS Cardiovascular: There is no cardiomegaly or pericardial effusion. The thoracic aorta is unremarkable. There is no CT evidence  of pulmonary embolism. Mediastinum/Nodes: No hilar or mediastinal adenopathy. The esophagus is grossly unremarkable. There is air in the upper mediastinum and lower neck which is new compared to the prior CT and likely related to recent tracheostomy placement. Lungs/Pleura: Minimal bibasilar subpleural atelectasis. No focal consolidation, pleural effusion, or pneumothorax. The central airways are patent. Musculoskeletal: No chest wall abnormality. No acute or significant osseous findings. Review of the MIP images confirms the above findings. CT ABDOMEN and PELVIS FINDINGS No intra-abdominal free air or free fluid. Hepatobiliary: No focal liver abnormality is seen. No gallstones, gallbladder wall thickening, or biliary dilatation. Pancreas: Unremarkable. No pancreatic ductal dilatation or surrounding inflammatory changes. Spleen: Normal in size without focal abnormality. Adrenals/Urinary Tract: The adrenal glands are unremarkable. Probable punctate nonobstructing left renal inferior pole calculus. There is no hydronephrosis on either side. There is symmetric enhancement and excretion of contrast by both kidneys. The visualized ureters appear unremarkable. The urinary bladder is decompressed around a Foley catheter. There is air within the bladder introduced by the Foley. Stomach/Bowel: There is no bowel obstruction or active inflammation. Normal appendix. Vascular/Lymphatic: Moderate aortoiliac atherosclerotic disease. No portal venous gas. There is no adenopathy. Reproductive: The prostate and seminal vesicles are grossly unremarkable. No pelvic mass. Other: None Musculoskeletal: Irregularity of the lateral aspect of the right iliac wing, likely related to prior fracture or bone graft donation site. No acute osseous pathology. Review of the MIP images confirms the above findings. IMPRESSION: 1. No acute intrathoracic, abdominal, or pelvic pathology. No CT evidence of pulmonary embolism. 2. Air in the upper  mediastinum and lower neck likely related to recent tracheostomy placement. Electronically Signed   By: Anner Crete M.D.   On: 09/10/2018 23:04   Ct Abdomen Pelvis W Contrast  Result Date: 09/10/2018 CLINICAL DATA:  52 year old male with concern for laryngeal cancer presents with shortness of breath and stridor.  EXAM: CT ANGIOGRAPHY CHEST CT ABDOMEN AND PELVIS WITH CONTRAST TECHNIQUE: Multidetector CT imaging of the chest was performed using the standard protocol during bolus administration of intravenous contrast. Multiplanar CT image reconstructions and MIPs were obtained to evaluate the vascular anatomy. Multidetector CT imaging of the abdomen and pelvis was performed using the standard protocol during bolus administration of intravenous contrast. CONTRAST:  44mL OMNIPAQUE IOHEXOL 350 MG/ML SOLN COMPARISON:  Chest radiograph dated 09/10/2018 and neck CT dated 09/10/2018 FINDINGS: Evaluation is limited due to streak artifact caused by patient's arms. CTA CHEST FINDINGS Cardiovascular: There is no cardiomegaly or pericardial effusion. The thoracic aorta is unremarkable. There is no CT evidence of pulmonary embolism. Mediastinum/Nodes: No hilar or mediastinal adenopathy. The esophagus is grossly unremarkable. There is air in the upper mediastinum and lower neck which is new compared to the prior CT and likely related to recent tracheostomy placement. Lungs/Pleura: Minimal bibasilar subpleural atelectasis. No focal consolidation, pleural effusion, or pneumothorax. The central airways are patent. Musculoskeletal: No chest wall abnormality. No acute or significant osseous findings. Review of the MIP images confirms the above findings. CT ABDOMEN and PELVIS FINDINGS No intra-abdominal free air or free fluid. Hepatobiliary: No focal liver abnormality is seen. No gallstones, gallbladder wall thickening, or biliary dilatation. Pancreas: Unremarkable. No pancreatic ductal dilatation or surrounding inflammatory  changes. Spleen: Normal in size without focal abnormality. Adrenals/Urinary Tract: The adrenal glands are unremarkable. Probable punctate nonobstructing left renal inferior pole calculus. There is no hydronephrosis on either side. There is symmetric enhancement and excretion of contrast by both kidneys. The visualized ureters appear unremarkable. The urinary bladder is decompressed around a Foley catheter. There is air within the bladder introduced by the Foley. Stomach/Bowel: There is no bowel obstruction or active inflammation. Normal appendix. Vascular/Lymphatic: Moderate aortoiliac atherosclerotic disease. No portal venous gas. There is no adenopathy. Reproductive: The prostate and seminal vesicles are grossly unremarkable. No pelvic mass. Other: None Musculoskeletal: Irregularity of the lateral aspect of the right iliac wing, likely related to prior fracture or bone graft donation site. No acute osseous pathology. Review of the MIP images confirms the above findings. IMPRESSION: 1. No acute intrathoracic, abdominal, or pelvic pathology. No CT evidence of pulmonary embolism. 2. Air in the upper mediastinum and lower neck likely related to recent tracheostomy placement. Electronically Signed   By: Anner Crete M.D.   On: 09/10/2018 23:04   US Venous Img Lower Bilateral  Result Date: 09/10/2018 CLINICAL DATA:  Initial evaluation for lower extremity swelling, edema. EXAM: BILATERAL LOWER EXTREMITY VENOUS DOPPLER ULTRASOUND TECHNIQUE: Gray-scale sonography with graded compression, as well as color Doppler and duplex ultrasound were performed to evaluate the lower extremity deep venous systems from the level of the common femoral vein and including the common femoral, femoral, profunda femoral, popliteal and calf veins including the posterior tibial, peroneal and gastrocnemius veins when visible. The superficial great saphenous vein was also interrogated. Spectral Doppler was utilized to evaluate flow at rest  and with distal augmentation maneuvers in the common femoral, femoral and popliteal veins. COMPARISON:  None. FINDINGS: RIGHT LOWER EXTREMITY Common Femoral Vein: No evidence of thrombus. Normal compressibility, respiratory phasicity and response to augmentation. Saphenofemoral Junction: No evidence of thrombus. Normal compressibility and flow on color Doppler imaging. Profunda Femoral Vein: No evidence of thrombus. Normal compressibility and flow on color Doppler imaging. Femoral Vein: No evidence of thrombus. Normal compressibility, respiratory phasicity and response to augmentation. Popliteal Vein: No evidence of thrombus. Normal compressibility, respiratory phasicity and response to augmentation.  Calf Veins: No evidence of thrombus. Normal compressibility and flow on color Doppler imaging. Superficial Great Saphenous Vein: No evidence of thrombus. Normal compressibility. Venous Reflux:  None. Other Findings:  None. LEFT LOWER EXTREMITY Common Femoral Vein: No evidence of thrombus. Normal compressibility, respiratory phasicity and response to augmentation. Saphenofemoral Junction: No evidence of thrombus. Normal compressibility and flow on color Doppler imaging. Profunda Femoral Vein: No evidence of thrombus. Normal compressibility and flow on color Doppler imaging. Femoral Vein: No evidence of thrombus. Normal compressibility, respiratory phasicity and response to augmentation. Popliteal Vein: No evidence of thrombus. Normal compressibility, respiratory phasicity and response to augmentation. Calf Veins: No evidence of thrombus. Normal compressibility and flow on color Doppler imaging. Superficial Great Saphenous Vein: No evidence of thrombus. Normal compressibility. Venous Reflux:  None. Other Findings:  None. IMPRESSION: No evidence of deep venous thrombosis. Electronically Signed   By: Jeannine Boga M.D.   On: 09/10/2018 23:12   Dg Chest Port 1 View  Result Date: 09/10/2018 CLINICAL DATA:   Tracheostomy placement. EXAM: PORTABLE CHEST 1 VIEW COMPARISON:  Chest x-ray from earlier same day FINDINGS: Tracheostomy tube appears appropriately positioned in the midline with tip at the level of the clavicles. Heart size and mediastinal contours are within normal limits. Lungs are clear. No pleural effusion or pneumothorax seen. Osseous structures about the chest are unremarkable. IMPRESSION: Tracheostomy tube appears appropriately positioned in the midline. Lungs are clear. Electronically Signed   By: Franki Cabot M.D.   On: 09/10/2018 15:53    EKG:  No orders found for this or any previous visit.  ASSESSMENT AND PLAN:  #1 acute upper airway obstruction status post emergency tracheostomy by ENT.  Continue full vent support through trach.  Patient has CT head chest abdomen pelvis did not reveal any metastatic disease.  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  Needs Def[facility]itive [facility]  Continue IV Solu-Medrol, Bronchodilators.    Discussed with Dr. Rogue Bussing. All the records are reviewed and case discussed with Care Management/Social Workerr. Management plans discussed with the patient, family and they are in agreement.  CODE STATUS: Full code  TOTAL TIME TAKING CARE OF THIS PATIENT: 35 minutes.   POSSIBLE D/C IN 1-2 DAYS, DEPENDING ON CLINICAL CONDITION.   Epifanio Lesches M.D on 09/11/2018 at 4:04 PM  Between 7am to 6pm - Pager - [phone]  After 6pm go to [email or web address] - password [email or web address]  [facility]  Office  [phone]  CC: Primary care physician; System, Provider Not In   Note: This dictation was prepared with Dragon dictation along with smaller phrase technology. Any transcriptional errors that result from this process are unintentional.

## 2018-09-12 ENCOUNTER — Inpatient Hospital Stay: Payer: Medicaid Other

## 2018-09-12 LAB — GLUCOSE, CAPILLARY
Glucose-Capillary: 111 mg/dL — ABNORMAL HIGH (ref 70–99)
Glucose-Capillary: 126 mg/dL — ABNORMAL HIGH (ref 70–99)
Glucose-Capillary: 130 mg/dL — ABNORMAL HIGH (ref 70–99)
Glucose-Capillary: 133 mg/dL — ABNORMAL HIGH (ref 70–99)
Glucose-Capillary: 145 mg/dL — ABNORMAL HIGH (ref 70–99)

## 2018-09-12 LAB — CBC
HCT: 37.5 % — ABNORMAL LOW (ref 39.0–52.0)
Hemoglobin: 12.3 g/dL — ABNORMAL LOW (ref 13.0–17.0)
MCH: 28.7 pg (ref 26.0–34.0)
MCHC: 32.8 g/dL (ref 30.0–36.0)
MCV: 87.4 fL (ref 80.0–100.0)
NRBC: 0 % (ref 0.0–0.2)
Platelets: 223 10*3/uL (ref 150–400)
RBC: 4.29 MIL/uL (ref 4.22–5.81)
RDW: 13.6 % (ref 11.5–15.5)
WBC: 15.4 10*3/uL — ABNORMAL HIGH (ref 4.0–10.5)

## 2018-09-12 LAB — RENAL FUNCTION PANEL
Albumin: 3.2 g/dL — ABNORMAL LOW (ref 3.5–5.0)
Anion gap: 6 (ref 5–15)
BUN: 28 mg/dL — ABNORMAL HIGH (ref 6–20)
CO2: 28 mmol/L (ref 22–32)
Calcium: 8.4 mg/dL — ABNORMAL LOW (ref 8.9–10.3)
Chloride: 103 mmol/L (ref 98–111)
Creatinine, Ser: 0.67 mg/dL (ref 0.61–1.24)
GFR calc Af Amer: >60 mL/min (ref >60)
GFR calc non Af Amer: >60 mL/min (ref >60)
Glucose, Bld: 146 mg/dL — ABNORMAL HIGH (ref 70–99)
Phosphorus: 4.4 mg/dL (ref 2.5–4.6)
Potassium: 4.5 mmol/L (ref 3.5–5.1)
Sodium: 137 mmol/L (ref 135–145)

## 2018-09-12 MED ORDER — NICOTINE 21 MG/24HR TD PT24
21.0000 mg | MEDICATED_PATCH | Freq: Every day | TRANSDERMAL | Status: DC
  Administered 2018-09-12 – 2018-09-20 (×9): 21 mg via TRANSDERMAL
  Filled 2018-09-12 (×9): qty 1

## 2018-09-12 MED ORDER — VITAL HIGH PROTEIN PO LIQD: 1000.0000 mL | ORAL | Status: DC

## 2018-09-12 MED ORDER — PRO-STAT SUGAR FREE PO LIQD: 30.0000 mL | Freq: Two times a day (BID) | ORAL | Status: DC

## 2018-09-12 MED ORDER — POLYETHYLENE GLYCOL 3350 17 G PO PACK
17.0000 g | PACK | Freq: Every day | ORAL | Status: DC
  Administered 2018-09-13 – 2018-09-17 (×2): 17 g
  Filled 2018-09-12 (×5): qty 1

## 2018-09-12 MED ORDER — INSULIN ASPART 100 UNIT/ML ~~LOC~~ SOLN
0.0000 [IU] | Freq: Three times a day (TID) | SUBCUTANEOUS | Status: DC
  Administered 2018-09-12: 2 [IU] via SUBCUTANEOUS
  Administered 2018-09-13: 3 [IU] via SUBCUTANEOUS
  Administered 2018-09-13 – 2018-09-18 (×3): 2 [IU] via SUBCUTANEOUS
  Administered 2018-09-20: 09:00:00 3 [IU] via SUBCUTANEOUS
  Filled 2018-09-12 (×6): qty 1

## 2018-09-12 MED ORDER — DOCUSATE SODIUM 50 MG/5ML PO LIQD
50.0000 mg | Freq: Every day | ORAL | Status: DC
  Administered 2018-09-13 – 2018-09-17 (×2): 50 mg
  Filled 2018-09-12 (×6): qty 10

## 2018-09-12 MED ORDER — OSMOLITE 1.5 CAL PO LIQD: 1000.0000 mL | ORAL | Status: DC

## 2018-09-12 MED ORDER — FENTANYL CITRATE (PF) 100 MCG/2ML IJ SOLN
50.0000 ug | INTRAMUSCULAR | Status: DC | PRN
  Administered 2018-09-12: 100 ug via INTRAVENOUS
  Administered 2018-09-13 (×2): 50 ug via INTRAVENOUS
  Administered 2018-09-13: 100 ug via INTRAVENOUS
  Administered 2018-09-13: 75 ug via INTRAVENOUS
  Administered 2018-09-13 (×2): 100 ug via INTRAVENOUS
  Filled 2018-09-12 (×7): qty 2

## 2018-09-12 NOTE — Progress Notes (Signed)
Follow up - Critical Care Medicine Note  Patient Details:    Alan Chung is an 52 y.o. male. with a past medical history remarkable for hypertension, every day smoker, who began having symptoms of laryngitis for the past 1 to 2 months.  He was recently given a course of prednisone and antibiotics for possible laryngitis/bronchitis.  He presented to the emergency department today complaining of shortness of breath stating that the air is getting stuck in his throat.  On physical exam he was stridorous.  Dr. Pryor Ochoa saw him emergently in the emergency department.  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   Lines, Airways, Drains: Urethral Catheter Ova [place], RN Double-lumen 16 Fr. (Active)  Indication for Insertion or Continuance of Catheter Peri-operative use for selective surgical procedure 09/11/2018  4:15 AM  Site Assessment Clean;Intact 09/11/2018  4:15 AM  Catheter Maintenance Bag below level of bladder;Catheter secured;Drainage bag/tubing not touching floor;Insertion date on drainage bag;No dependent loops;Seal intact 09/11/2018  4:15 AM  Collection Container Standard drainage bag 09/11/2018  4:15 AM  Securement Method Securing device (Describe) 09/11/2018  4:15 AM  Urinary Catheter Interventions Unclamped 09/11/2018  4:15 AM    Anti-infectives:  Anti-infectives (From admission, onward)   None      Microbiology: Results for orders placed or performed during the hospital encounter of 09/10/18  MRSA PCR Screening     Status: None   Collection Time: 09/10/18  3:22 PM  Result Value Ref Range Status   MRSA by PCR NEGATIVE NEGATIVE Final    Comment:        The GeneXpert MRSA Assay (FDA approved for NASAL specimens only), is one component of a comprehensive MRSA colonization surveillance program. It is not intended to diagnose MRSA infection nor to guide or monitor treatment for MRSA infections. Performed at [facility], [address] [place], [place] [address]     Studies: Dg Chest 2 View  Result Date: 09/10/2018 CLINICAL DATA:  Patient with shortness of breath EXAM: CHEST - 2 VIEW COMPARISON:  Chest radiograph 08/23/2018 FINDINGS: The heart size and mediastinal contours are within normal limits. Both lungs are clear. The visualized skeletal structures are unremarkable. IMPRESSION: No active cardiopulmonary disease. Electronically Signed   By: Lovey Newcomer M.D.   On: 09/10/2018 10:48   Dg Chest 2 View  Result Date: 08/23/2018 CLINICAL DATA:  Sore throat for 2 months. Laryngitis. Nonproductive cough. Smoker. EXAM: CHEST - 2 VIEW COMPARISON:  None. FINDINGS: Normal heart size and pulmonary vascularity. No airspace disease or consolidation in the lungs. Blunting of the left costophrenic angle likely representing a small pleural effusion. No pneumothorax. Mediastinal contours appear intact. IMPRESSION: Small left pleural effusion. No evidence of active pulmonary disease. Electronically Signed   By: Lucienne Capers M.D.   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 EXAM: CT HEAD WITHOUT CONTRAST TECHNIQUE: Contiguous axial images were obtained from the base of the skull through the vertex without intravenous contrast. COMPARISON:  Prior neck CT from earlier the same day. FINDINGS: Brain: Cerebral volume within normal limits for age. No acute intracranial hemorrhage. No acute large vessel territory infarct. No mass lesion, midline shift or mass effect. No hydrocephalus. No extra-axial fluid collection. Vascular: No hyperdense vessel. Skull: Scalp soft tissues demonstrate no acute finding. 4.1 cm lipoma noted at the left occipital scalp. Calvarium intact. No focal osseous lesions. Sinuses/Orbits: Globes and  orbital soft tissues within normal limits. Mild scattered mucosal thickening within the ethmoidal air cells. Paranasal sinuses are otherwise  clear. No mastoid effusion. Other: None. IMPRESSION: 1. No acute intracranial abnormality. No appreciable intracranial mass to suggest metastatic disease. 2. 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 Electronically Signed   By: Jeannine Boga M.D.   On: 09/10/2018 23:05   Ct Soft Tissue Neck W Contrast  Addendum Date: 09/11/2018   ADDENDUM REPORT: 09/11/2018 07:32 ADDENDUM: Repeat scanning was performed at 2216 hours. The patient has undergone tracheostomy which appears satisfactory. There is expected air/gas in the soft tissues of the upper mediastinum and neck following this procedure. There is now complete obstruction at the level the hypopharynx and supraglottic airway. There is a large mass of the inferior oropharynx and supraglottic region, possibly arising from the epiglottis. Mass measures up to 5 cm in size. The lesion is bilateral but more bulky on the left. Question erosion of the upper portion of the thyroid cartilage on the left. There is enlarged level 3 node on the right axial image 61 consistent with metastatic involvement. Enlarged level 5 nodes on the left may be involved by metastatic disease, largest node short axis dimension 11 mm image 67. IMPRESSION: IMPRESSION Repeat study performed after tracheostomy. Tracheostomy appears satisfactory. Air/gas in the soft tissue planes of the upper mediastinum and neck as expected. Large irregular supraglottic and inferior oro pharyngeal mass, possibly arising from the region of the epiglottis. Mass measures up to 5 cm in size. More bulky on the left, but involving both sides. Questionable left superior thyroid cartilage erosion. Likely malignant adenopathy, level 3 on the right and level 5 on the left. Electronically Signed   By: Nelson Chimes M.D.   On: 09/11/2018 07:32   Result Date: 09/11/2018 CLINICAL DATA:  52 year old male with laryngitis symptoms for the past 1-2 months and 20 lb unintentional weight loss. EXAM: CT NECK WITH CONTRAST  TECHNIQUE: Multidetector CT imaging of the neck was performed using the standard protocol following the bolus administration of intravenous contrast. CONTRAST:  63mL OMNIPAQUE IOHEXOL 300 MG/ML  SOLN COMPARISON:  Face CT 04/10/2014. FINDINGS: This exam is moderately to severely motion degraded throughout. I asked for the study to be repeated with a smaller additional contrast bolus, but the emergency department has declined this for now, the patient is being seen by ENT and possibly being intubated. Pharynx and larynx: The larynx detail is highly degraded by motion. There is abnormal bilateral laryngeal soft tissue thickening ranging from 15-25 millimeters and most pronounced at the anterior, STIR as seen on series 2, image 67. This appears to be at the level of the false cords and AE folds, and is fairly symmetric. The epiglottis is not evaluated. The true cords on series 2, image 81 have a more normal appearance. There may be abnormal soft tissue on both sides of the thyroid cartilage, uncertain. The hypopharynx is distended with gas. The oropharynx and nasopharynx contours are within normal limits. Negative parapharyngeal and retropharyngeal spaces. Salivary glands: Limited due to motion. No sublingual space, submandibular or parotid abnormality identified. Thyroid: Limited due to motion, no abnormality identified. Lymph nodes: Limited due to motion. There is an enlarged right level IIIa lymph nodes suspected on series 2, image 68 measuring 12 millimeters short axis. Contralateral left level 3 lymphadenopathy is possible. Bilateral level 2 lymph nodes appear more normal. No cystic or necrotic node is evident. Vascular: Detail severely degraded by motion. Grossly patent major vascular structures  in the neck. Limited intracranial: Limited by motion, negative visible brain parenchyma. Visualized orbits: Negative. Mastoids and visualized paranasal sinuses: Well pneumatized aside from mild to moderate left maxillary  sinus mucosal thickening. Skeleton: Limited due to motion. Multilevel degenerative changes in the cervical spine. No acute or suspicious osseous lesion identified. Upper chest: The subglottic trachea appears within normal limits. No superior mediastinal lymphadenopathy. Negative visible lung parenchyma. Other findings: Benign left occipital scalp lipoma, 39 millimeters diameter by 14 millimeters in thickness. IMPRESSION: 1. Limited by motion artifact throughout, severely so at the hypopharynx and supraglottic larynx. Patient unavailable for repeat imaging at this time per the ED. A repeat Neck CT (IV contrast preferred) is suggested when possible for improved imaging accuracy. 2. 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 Possible involvement of the bilateral thyroid cartilage. 3. Hypopharynx distended with gas raising the possibility of airway stenosis. 4. Suspected malignant right level IIIa lymph node, 12 mm short axis on series 2, image 68. Electronically Signed: By: Genevie Ann M.D. On: 09/10/2018 13:37   Ct Angio Chest Pe W Or Wo Contrast  Result Date: 09/10/2018 CLINICAL DATA:  52 year old male with concern for laryngeal cancer presents with shortness of breath and stridor. EXAM: CT ANGIOGRAPHY CHEST CT ABDOMEN AND PELVIS WITH CONTRAST TECHNIQUE: Multidetector CT imaging of the chest was performed using the standard protocol during bolus administration of intravenous contrast. Multiplanar CT image reconstructions and MIPs were obtained to evaluate the vascular anatomy. Multidetector CT imaging of the abdomen and pelvis was performed using the standard protocol during bolus administration of intravenous contrast. CONTRAST:  82mL OMNIPAQUE IOHEXOL 350 MG/ML SOLN COMPARISON:  Chest radiograph dated 09/10/2018 and neck CT dated 09/10/2018 FINDINGS: Evaluation is limited due to streak  artifact caused by patient's arms. CTA CHEST FINDINGS Cardiovascular: There is no cardiomegaly or pericardial effusion. The thoracic aorta is unremarkable. There is no CT evidence of pulmonary embolism. Mediastinum/Nodes: No hilar or mediastinal adenopathy. The esophagus is grossly unremarkable. There is air in the upper mediastinum and lower neck which is new compared to the prior CT and likely related to recent tracheostomy placement. Lungs/Pleura: Minimal bibasilar subpleural atelectasis. No focal consolidation, pleural effusion, or pneumothorax. The central airways are patent. Musculoskeletal: No chest wall abnormality. No acute or significant osseous findings. Review of the MIP images confirms the above findings. CT ABDOMEN and PELVIS FINDINGS No intra-abdominal free air or free fluid. Hepatobiliary: No focal liver abnormality is seen. No gallstones, gallbladder wall thickening, or biliary dilatation. Pancreas: Unremarkable. No pancreatic ductal dilatation or surrounding inflammatory changes. Spleen: Normal in size without focal abnormality. Adrenals/Urinary Tract: The adrenal glands are unremarkable. Probable punctate nonobstructing left renal inferior pole calculus. There is no hydronephrosis on either side. There is symmetric enhancement and excretion of contrast by both kidneys. The visualized ureters appear unremarkable. The urinary bladder is decompressed around a Foley catheter. There is air within the bladder introduced by the Foley. Stomach/Bowel: There is no bowel obstruction or active inflammation. Normal appendix. Vascular/Lymphatic: Moderate aortoiliac atherosclerotic disease. No portal venous gas. There is no adenopathy. Reproductive: The prostate and seminal vesicles are grossly unremarkable. No pelvic mass. Other: None Musculoskeletal: Irregularity of the lateral aspect of the right iliac wing, likely related to prior fracture or bone graft donation site. No acute osseous pathology. Review of  the MIP images confirms the above findings. IMPRESSION: 1. No acute intrathoracic, abdominal, or pelvic pathology. No CT evidence  of pulmonary embolism. 2. Air in the upper mediastinum and lower neck likely related to recent tracheostomy placement. Electronically Signed   By: Anner Crete M.D.   On: 09/10/2018 23:04   Ct Abdomen Pelvis W Contrast  Result Date: 09/10/2018 CLINICAL DATA:  52 year old male with concern for laryngeal cancer presents with shortness of breath and stridor. EXAM: CT ANGIOGRAPHY CHEST CT ABDOMEN AND PELVIS WITH CONTRAST TECHNIQUE: Multidetector CT imaging of the chest was performed using the standard protocol during bolus administration of intravenous contrast. Multiplanar CT image reconstructions and MIPs were obtained to evaluate the vascular anatomy. Multidetector CT imaging of the abdomen and pelvis was performed using the standard protocol during bolus administration of intravenous contrast. CONTRAST:  65mL OMNIPAQUE IOHEXOL 350 MG/ML SOLN COMPARISON:  Chest radiograph dated 09/10/2018 and neck CT dated 09/10/2018 FINDINGS: Evaluation is limited due to streak artifact caused by patient's arms. CTA CHEST FINDINGS Cardiovascular: There is no cardiomegaly or pericardial effusion. The thoracic aorta is unremarkable. There is no CT evidence of pulmonary embolism. Mediastinum/Nodes: No hilar or mediastinal adenopathy. The esophagus is grossly unremarkable. There is air in the upper mediastinum and lower neck which is new compared to the prior CT and likely related to recent tracheostomy placement. Lungs/Pleura: Minimal bibasilar subpleural atelectasis. No focal consolidation, pleural effusion, or pneumothorax. The central airways are patent. Musculoskeletal: No chest wall abnormality. No acute or significant osseous findings. Review of the MIP images confirms the above findings. CT ABDOMEN and PELVIS FINDINGS No intra-abdominal free air or free fluid. Hepatobiliary: No focal liver  abnormality is seen. No gallstones, gallbladder wall thickening, or biliary dilatation. Pancreas: Unremarkable. No pancreatic ductal dilatation or surrounding inflammatory changes. Spleen: Normal in size without focal abnormality. Adrenals/Urinary Tract: The adrenal glands are unremarkable. Probable punctate nonobstructing left renal inferior pole calculus. There is no hydronephrosis on either side. There is symmetric enhancement and excretion of contrast by both kidneys. The visualized ureters appear unremarkable. The urinary bladder is decompressed around a Foley catheter. There is air within the bladder introduced by the Foley. Stomach/Bowel: There is no bowel obstruction or active inflammation. Normal appendix. Vascular/Lymphatic: Moderate aortoiliac atherosclerotic disease. No portal venous gas. There is no adenopathy. Reproductive: The prostate and seminal vesicles are grossly unremarkable. No pelvic mass. Other: None Musculoskeletal: Irregularity of the lateral aspect of the right iliac wing, likely related to prior fracture or bone graft donation site. No acute osseous pathology. Review of the MIP images confirms the above findings. IMPRESSION: 1. No acute intrathoracic, abdominal, or pelvic pathology. No CT evidence of pulmonary embolism. 2. Air in the upper mediastinum and lower neck likely related to recent tracheostomy placement. Electronically Signed   By: Anner Crete M.D.   On: 09/10/2018 23:04   US Venous Img Lower Bilateral  Result Date: 09/10/2018 CLINICAL DATA:  Initial evaluation for lower extremity swelling, edema. EXAM: BILATERAL LOWER EXTREMITY VENOUS DOPPLER ULTRASOUND TECHNIQUE: Gray-scale sonography with graded compression, as well as color Doppler and duplex ultrasound were performed to evaluate the lower extremity deep venous systems from the level of the common femoral vein and including the common femoral, femoral, profunda femoral, popliteal and calf veins including the  posterior tibial, peroneal and gastrocnemius veins when visible. The superficial great saphenous vein was also interrogated. Spectral Doppler was utilized to evaluate flow at rest and with distal augmentation maneuvers in the common femoral, femoral and popliteal veins. COMPARISON:  None. FINDINGS: RIGHT LOWER EXTREMITY Common Femoral Vein: No evidence of thrombus. Normal compressibility, respiratory phasicity and  response to augmentation. Saphenofemoral Junction: No evidence of thrombus. Normal compressibility and flow on color Doppler imaging. Profunda Femoral Vein: No evidence of thrombus. Normal compressibility and flow on color Doppler imaging. Femoral Vein: No evidence of thrombus. Normal compressibility, respiratory phasicity and response to augmentation. Popliteal Vein: No evidence of thrombus. Normal compressibility, respiratory phasicity and response to augmentation. Calf Veins: No evidence of thrombus. Normal compressibility and flow on color Doppler imaging. Superficial Great Saphenous Vein: No evidence of thrombus. Normal compressibility. Venous Reflux:  None. Other Findings:  None. LEFT LOWER EXTREMITY Common Femoral Vein: No evidence of thrombus. Normal compressibility, respiratory phasicity and response to augmentation. Saphenofemoral Junction: No evidence of thrombus. Normal compressibility and flow on color Doppler imaging. Profunda Femoral Vein: No evidence of thrombus. Normal compressibility and flow on color Doppler imaging. Femoral Vein: No evidence of thrombus. Normal compressibility, respiratory phasicity and response to augmentation. Popliteal Vein: No evidence of thrombus. Normal compressibility, respiratory phasicity and response to augmentation. Calf Veins: No evidence of thrombus. Normal compressibility and flow on color Doppler imaging. Superficial Great Saphenous Vein: No evidence of thrombus. Normal compressibility. Venous Reflux:  None. Other Findings:  None. IMPRESSION: No evidence  of deep venous thrombosis. Electronically Signed   By: Jeannine Boga M.D.   On: 09/10/2018 23:12   Dg Chest Port 1 View  Result Date: 09/10/2018 CLINICAL DATA:  Tracheostomy placement. EXAM: PORTABLE CHEST 1 VIEW COMPARISON:  Chest x-ray from earlier same day FINDINGS: Tracheostomy tube appears appropriately positioned in the midline with tip at the level of the clavicles. Heart size and mediastinal contours are within normal limits. Lungs are clear. No pleural effusion or pneumothorax seen. Osseous structures about the chest are unremarkable. IMPRESSION: Tracheostomy tube appears appropriately positioned in the midline. Lungs are clear. Electronically Signed   By: Franki Cabot M.D.   On: 09/10/2018 15:53    Consults: Treatment Team:  Pccm, Ander Gaster, MD Hermelinda Dellen, DO Cammie Sickle, MD   Subjective:    Overnight Issues: No significant issues overnight.  Patient sedated on mechanical ventilation.  CT scans obtained.  No clear evidence of metastatic disease.  Appreciate ENT and oncology assistance  Objective:  Vital signs for last 24 hours: Temp:  [97.2 F (36.2 C)-97.9 F (36.6 C)] 97.9 F (36.6 C) (12/08 0100) Pulse Rate:  [61-79] 63 (12/08 0400) Resp:  [12-21] 15 (12/08 0400) BP: (92-147)/(62-79) 127/76 (12/08 0400) SpO2:  [96 %-100 %] 97 % (12/08 0400) FiO2 (%):  [30 %-35 %] 30 % (12/08 0236)  Hemodynamic parameters for last 24 hours:    Intake/Output from previous day: 12/07 0701 - 12/08 0700 In: 2467.5 [I.V.:2467.5] Out: 515 [Urine:515]  Intake/Output this shift: Total I/O In: 1425.9 [I.V.:1425.9] Out: 315 [Urine:315]  Vent settings for last 24 hours: Vent Mode: PRVC FiO2 (%):  [30 %-35 %] 30 % Set Rate:  [15 bmp] 15 bmp Vt Set:  [500 mL] 500 mL PEEP:  [5 cmH20] 5 cmH20 Plateau Pressure:  [14 cmH20] 14 cmH20  Physical Exam:  Patient resting comfortably, on mechanical ventilation and sedation with propofol Vital signs:       Please see the  above listed vital signs HEENT:           Patient with tracheostomy in place, blood noted Cardiovascular:           Regular rate and rhythm Pulmonary:      Coarse expiratory rhonchi appreciated bilaterally Abdominal:      Positive bowel sounds, soft exam Extremities:  Patient with prior surgery on right lower extremity.  Some asymmetric swelling left greater than right Neurologic:       Limited exam at this time  Assessment/Plan:   Acute upper airway obstruction.  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  CT scan of head chest abdomen and pelvis did not reveal any clear evidence of metastatic disease.  Patient with underlying bronchospasm was started on Solu-Medrol and bronchodilators yesterday.  Will begin decreasing sedation to wake patient up. Appreciate oncology and ENT patient will most likely need to be transferred to tertiary center for laryngeal resection  Asymmetric lower extremity edema.    Dopplers of the lower extremities were negative  Prerenal azotemia.  Will follow  Leukocytosis.  Will recheck chest x-ray and obtain sputum culture  Anemia.  No evidence of bleeding  Critical Care Total Time 35 minutes  Hermelinda Dellen, DO Caress Reffitt 09/12/2018  *Care during the described time interval was provided by me and/or other providers on the critical care team.  I have reviewed this patient's available data, including medical history, events of note, physical examination and test results as part of my evaluation. Patient ID: Nikoloz Huy, male   DOB: 1966-07-25, 52 y.o.   MRN: [id number]

## 2018-09-12 NOTE — Anesthesia Postprocedure Evaluation (Signed)
Anesthesia Post Note  Patient: Alan Chung  Procedure(s) Performed: TRACHEOSTOMY (N/A ) DIAGNOSTIC LARYNGOSCOPY WITH BIOPSY  Patient location during evaluation: ICU Anesthesia Type: MAC Level of consciousness: awake and alert Pain management: pain level controlled Vital Signs Assessment: post-procedure vital signs reviewed and stable Respiratory status: patient connected to tracheostomy mask oxygen Cardiovascular status: stable Postop Assessment: no apparent nausea or vomiting Anesthetic complications: no     Last Vitals:  Vitals:   09/12/18 1700 09/12/18 1800  BP: 132/71   Pulse: 76 75  Resp: 18 17  Temp:    SpO2: 96% 98%    Last Pain:  Vitals:   09/12/18 1608  TempSrc: Oral  PainSc: 0-No pain                 Durenda Hurt

## 2018-09-12 NOTE — Progress Notes (Signed)
Initial Nutrition Assessment  DOCUMENTATION CODES:   Non-severe (moderate) malnutrition in context of chronic illness, Obesity unspecified  INTERVENTION:  Once NGT placed and confirmed, recommend initiating Osmolite 1.5 at 20 mL/hr and advancing by 20 mL/hr every 8 hours to goal rate of 60 mL/hr (1440 mL goal daily volume). Also provide Pro-Stat 30 mL BID per tube. Goal TF regimen provides 2360 kcal, 120 grams of protein, 1094 ml H2O daily.  For now provide minimum free water flush of 30 mL Q4hrs to maintain tube patency. Patient is currently on normal saline at 75 mL/hr. Will continue to monitor IV fluid changes, UOP, weight trend, and labs.  Goal TF regimen meets 100% RDIs for vitamins/minerals.  Monitor magnesium, potassium, and phosphorus daily for at least 3 days, MD to replete as needed, as pt is at risk for refeeding syndrome given moderate malnutrition.  If patient does end up transferring to tertiary care center for laryngeal resection, he would greatly benefit from placement of a surgical J-tube during the laryngeal resection for ongoing nutrition support during treatment.  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   -Pending pathology results. Per Oncology note patient will need treatment with surgery at a tertiary cancer center if he is positive for SCC.  Patient on tracheostomy collar with O2 flow rate of 8 L/min and FiO2 28%. Abdomen somewhat distended but soft. No BM yet this admission. Patient able to communicate by writing on paper and his brother is also at bedside. He has had difficulty swallowing for the past few months and his intake has decreased. He reports his UBW was 265 lbs (120.5 kg) and he has lost a significant amount of weight over the past 6 months. There is no recent weight history to trend. Patient is currently 96.2 kg (212.08 lbs). That is a reported weight loss of 24.3 kg (20.2% body weight) over the past 6 months, which is significant for time frame.  Enteral Access: no enteral access at this time; plan is for placement today  MAP: 83-98 mmHg  Medications reviewed and include: Solu-Medrol 60 mg Q6hrs IV, pantoprazole, NS @ 75 mL/hr, fentanyl gtt.  Labs reviewed: CBG 133, BUN 28.  I/O: 515 mL UOP yesterday (0.2 mL/kg/hr)  Discussed with RN. Plan is for placement of NGT today and initiation of tube feeds once patient is tolerating trach collar.  NUTRITION - FOCUSED PHYSICAL EXAM:    Most Recent Value  Orbital Region  Mild depletion  Upper Arm Region  Moderate depletion  Thoracic and Lumbar Region  Mild depletion  Buccal Region  Unable to assess  Temple Region  Moderate depletion  Clavicle Bone Region  Moderate depletion  Clavicle and Acromion Bone Region  Moderate depletion  Scapular Bone Region  Unable to assess  Dorsal  Hand  No depletion  Patellar Region  No depletion  Anterior Thigh Region  No depletion  Posterior Calf Region  No depletion  Edema (RD Assessment)  Mild  Hair  Reviewed  Eyes  Reviewed  Mouth  Unable to assess  Skin  Reviewed  Nails  Reviewed     Diet Order:   Diet Order            Diet NPO time specified  Diet effective now              EDUCATION NEEDS:   No education needs have been identified at this time  Skin:  Skin Assessment: Skin Integrity Issues: Skin Integrity Issues:: Incisions Incisions: closed incision to neck  Last BM:  Unknown/PTA  Height:   Ht Readings from Last 1 Encounters:  09/10/18 5\' 8"  (1.727 m)   Weight:   Wt Readings from Last 1 Encounters:  09/10/18 96.2 kg   Ideal Body Weight:  70 kg  BMI:  Body mass index is 32.25 kg/m.  Estimated Nutritional Needs:   Kcal:  2150-2500 (MSJ 1790 x 1.2-1.4)  Protein:  105-125 grams (1.1-1.3 grams/kg actual body wt; 1.5-1.8 grams/kg IBW)  Fluid:  2.1-2.4 L/day (30-35 mL/kg IBW)  Willey Blade, MS, RD, LDN Office: [phone] Pager: [phone] After Hours/Weekend Pager: [phone]

## 2018-09-12 NOTE — Progress Notes (Signed)
[facility] NAME: Alan Chung    MR#:  [id number]  DATE OF BIRTH:  09-01-1966  SUBJECTIVE: Patient seen at bedside, had emergency tracheostomy yesterday. Patient has a primary laryngeal carcinoma, waiting for pathology results.  On full vent support with sedation.  CHIEF COMPLAINT:   Chief Complaint  Patient presents with  . Shortness of Breath    REVIEW OF SYSTEMS:   ROS Review of systems not possible because of intubation, sedation. DRUG ALLERGIES:   Allergies  Allergen Reactions  . Tramadol Itching    VITALS:  Blood pressure 122/66, pulse 75, temperature 97.9 F (36.6 C), temperature source Axillary, resp. rate 16, height 5\' 8"  (1.727 m), weight 96.2 kg, SpO2 96 %.  PHYSICAL EXAMINATION:  GENERAL:  52 y.o.-year-old patient lying in the bed status , trach done, on full vent support. EYES: Pupils equal, round, reactive to light . No scleral icterus. Extraocular muscles intact.  HEENT: Head atraumatic, normocephalic. Oropharynx and nasopharynx clear.  NECK:  Supple, no jugular venous distention. No thyroid enlargement, no tenderness.  LUNGS: Normal breath sounds bilaterally, no wheezing, rales,rhonchi or crepitation. No use of accessory muscles of respiration.  CARDIOVASCULAR: S1, S2 normal. No murmurs, rubs, or gallops.  ABDOMEN: Soft, nontender, nondistended. Bowel sounds present. No organomegaly or mass.  EXTREMITIES: No pedal edema, cyanosis, or clubbing.  NEUROLOGIC: On full vent support through trach.  Sedated.  PSYCHIATRIC: The patient sedated.  SKIN: No obvious rash, lesion, or ulcer.    LABORATORY PANEL:   CBC Recent Labs  Lab 09/12/18 0423  WBC 15.4*  HGB 12.3*  HCT 37.5*  PLT 223   ------------------------------------------------------------------------------------------------------------------  Chemistries  Recent Labs  Lab 09/11/18 0426 09/12/18 0423  NA 138 137  K 3.8 4.5  CL 101  103  CO2 29 28  GLUCOSE 163* 146*  BUN 11 28*  CREATININE 0.66 0.67  CALCIUM 8.7* 8.4*  MG 1.9  --   AST 16  --   ALT 15  --   ALKPHOS 47  --   BILITOT 0.3  --    ------------------------------------------------------------------------------------------------------------------  Cardiac Enzymes Recent Labs  Lab 09/10/18 1046  TROPONINI <0.03   ------------------------------------------------------------------------------------------------------------------  RADIOLOGY:  Dg Chest 2 View  Result Date: 09/10/2018 CLINICAL DATA:  Patient with shortness of breath EXAM: CHEST - 2 VIEW COMPARISON:  Chest radiograph 08/23/2018 FINDINGS: The heart size and mediastinal contours are within normal limits. Both lungs are clear. The visualized skeletal structures are unremarkable. IMPRESSION: No active cardiopulmonary disease. Electronically Signed   By: Lovey Newcomer M.D.   On: 09/10/2018 10:48   Ct Head Wo Contrast  Result Date: 09/10/2018 CLINICAL DATA:  Initial evaluation for new onset supraglottic cancer. EXAM: CT HEAD WITHOUT CONTRAST TECHNIQUE: Contiguous axial images were obtained from the base of the skull through the vertex without intravenous contrast. COMPARISON:  Prior neck CT from earlier the same day. FINDINGS: Brain: Cerebral volume within normal limits for age. No acute intracranial hemorrhage. No acute large vessel territory infarct. No mass lesion, midline shift or mass effect. No hydrocephalus. No extra-axial fluid collection. Vascular: No hyperdense vessel. Skull: Scalp soft tissues demonstrate no acute finding. 4.1 cm lipoma noted at the left occipital scalp. Calvarium intact. No focal osseous lesions. Sinuses/Orbits: Globes and orbital soft tissues within normal limits. Mild scattered mucosal thickening within the ethmoidal air cells. Paranasal sinuses are otherwise clear. No mastoid effusion. Other: None. IMPRESSION: 1. No acute intracranial abnormality. No appreciable intracranial  mass to suggest metastatic disease. 2. 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 Electronically Signed   By: Jeannine Boga M.D.   On: 09/10/2018 23:05   Ct Soft Tissue Neck W Contrast  Addendum Date: 09/11/2018   ADDENDUM REPORT: 09/11/2018 07:32 ADDENDUM: Repeat scanning was performed at 2216 hours. The patient has undergone tracheostomy which appears satisfactory. There is expected air/gas in the soft tissues of the upper mediastinum and neck following this procedure. There is now complete obstruction at the level the hypopharynx and supraglottic airway. There is a large mass of the inferior oropharynx and supraglottic region, possibly arising from the epiglottis. Mass measures up to 5 cm in size. The lesion is bilateral but more bulky on the left. Question erosion of the upper portion of the thyroid cartilage on the left. There is enlarged level 3 node on the right axial image 61 consistent with metastatic involvement. Enlarged level 5 nodes on the left may be involved by metastatic disease, largest node short axis dimension 11 mm image 67. IMPRESSION: IMPRESSION Repeat study performed after tracheostomy. Tracheostomy appears satisfactory. Air/gas in the soft tissue planes of the upper mediastinum and neck as expected. Large irregular supraglottic and inferior oro pharyngeal mass, possibly arising from the region of the epiglottis. Mass measures up to 5 cm in size. More bulky on the left, but involving both sides. Questionable left superior thyroid cartilage erosion. Likely malignant adenopathy, level 3 on the right and level 5 on the left. Electronically Signed   By: Nelson Chimes M.D.   On: 09/11/2018 07:32   Result Date: 09/11/2018 CLINICAL DATA:  52 year old male with laryngitis symptoms for the past 1-2 months and 20 lb unintentional weight loss. EXAM: CT NECK WITH CONTRAST TECHNIQUE: Multidetector CT imaging of the neck was performed using the standard protocol following the bolus  administration of intravenous contrast. CONTRAST:  13mL OMNIPAQUE IOHEXOL 300 MG/ML  SOLN COMPARISON:  Face CT 04/10/2014. FINDINGS: This exam is moderately to severely motion degraded throughout. I asked for the study to be repeated with a smaller additional contrast bolus, but the emergency department has declined this for now, the patient is being seen by ENT and possibly being intubated. Pharynx and larynx: The larynx detail is highly degraded by motion. There is abnormal bilateral laryngeal soft tissue thickening ranging from 15-25 millimeters and most pronounced at the anterior, STIR as seen on series 2, image 67. This appears to be at the level of the false cords and AE folds, and is fairly symmetric. The epiglottis is not evaluated. The true cords on series 2, image 81 have a more normal appearance. There may be abnormal soft tissue on both sides of the thyroid cartilage, uncertain. The hypopharynx is distended with gas. The oropharynx and nasopharynx contours are within normal limits. Negative parapharyngeal and retropharyngeal spaces. Salivary glands: Limited due to motion. No sublingual space, submandibular or parotid abnormality identified. Thyroid: Limited due to motion, no abnormality identified. Lymph nodes: Limited due to motion. There is an enlarged right level IIIa lymph nodes suspected on series 2, image 68 measuring 12 millimeters short axis. Contralateral left level 3 lymphadenopathy is possible. Bilateral level 2 lymph nodes appear more normal. No cystic or necrotic node is evident. Vascular: Detail severely degraded by motion. Grossly patent major vascular structures in the neck. Limited intracranial: Limited by motion, negative visible brain parenchyma. Visualized orbits: Negative. Mastoids and visualized paranasal sinuses: Well pneumatized aside from mild to moderate left maxillary sinus mucosal thickening. Skeleton: Limited due  to motion. Multilevel degenerative changes in the cervical  spine. No acute or suspicious osseous lesion identified. Upper chest: The subglottic trachea appears within normal limits. No superior mediastinal lymphadenopathy. Negative visible lung parenchyma. Other findings: Benign left occipital scalp lipoma, 39 millimeters diameter by 14 millimeters in thickness. IMPRESSION: 1. Limited by motion artifact throughout, severely so at the hypopharynx and supraglottic larynx. Patient unavailable for repeat imaging at this time per the ED. A repeat Neck CT (IV contrast preferred) is suggested when possible for improved imaging accuracy. 2. 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 Possible involvement of the bilateral thyroid cartilage. 3. Hypopharynx distended with gas raising the possibility of airway stenosis. 4. Suspected malignant right level IIIa lymph node, 12 mm short axis on series 2, image 68. Electronically Signed: By: Genevie Ann M.D. On: 09/10/2018 13:37   Ct Angio Chest Pe W Or Wo Contrast  Result Date: 09/10/2018 CLINICAL DATA:  52 year old male with concern for laryngeal cancer presents with shortness of breath and stridor. EXAM: CT ANGIOGRAPHY CHEST CT ABDOMEN AND PELVIS WITH CONTRAST TECHNIQUE: Multidetector CT imaging of the chest was performed using the standard protocol during bolus administration of intravenous contrast. Multiplanar CT image reconstructions and MIPs were obtained to evaluate the vascular anatomy. Multidetector CT imaging of the abdomen and pelvis was performed using the standard protocol during bolus administration of intravenous contrast. CONTRAST:  40mL OMNIPAQUE IOHEXOL 350 MG/ML SOLN COMPARISON:  Chest radiograph dated 09/10/2018 and neck CT dated 09/10/2018 FINDINGS: Evaluation is limited due to streak artifact caused by patient's arms. CTA CHEST FINDINGS Cardiovascular: There is no cardiomegaly or  pericardial effusion. The thoracic aorta is unremarkable. There is no CT evidence of pulmonary embolism. Mediastinum/Nodes: No hilar or mediastinal adenopathy. The esophagus is grossly unremarkable. There is air in the upper mediastinum and lower neck which is new compared to the prior CT and likely related to recent tracheostomy placement. Lungs/Pleura: Minimal bibasilar subpleural atelectasis. No focal consolidation, pleural effusion, or pneumothorax. The central airways are patent. Musculoskeletal: No chest wall abnormality. No acute or significant osseous findings. Review of the MIP images confirms the above findings. CT ABDOMEN and PELVIS FINDINGS No intra-abdominal free air or free fluid. Hepatobiliary: No focal liver abnormality is seen. No gallstones, gallbladder wall thickening, or biliary dilatation. Pancreas: Unremarkable. No pancreatic ductal dilatation or surrounding inflammatory changes. Spleen: Normal in size without focal abnormality. Adrenals/Urinary Tract: The adrenal glands are unremarkable. Probable punctate nonobstructing left renal inferior pole calculus. There is no hydronephrosis on either side. There is symmetric enhancement and excretion of contrast by both kidneys. The visualized ureters appear unremarkable. The urinary bladder is decompressed around a Foley catheter. There is air within the bladder introduced by the Foley. Stomach/Bowel: There is no bowel obstruction or active inflammation. Normal appendix. Vascular/Lymphatic: Moderate aortoiliac atherosclerotic disease. No portal venous gas. There is no adenopathy. Reproductive: The prostate and seminal vesicles are grossly unremarkable. No pelvic mass. Other: None Musculoskeletal: Irregularity of the lateral aspect of the right iliac wing, likely related to prior fracture or bone graft donation site. No acute osseous pathology. Review of the MIP images confirms the above findings. IMPRESSION: 1. No acute intrathoracic, abdominal, or  pelvic pathology. No CT evidence of pulmonary embolism. 2. Air in the upper mediastinum and lower neck likely related to recent tracheostomy placement. Electronically Signed   By: Anner Crete M.D.   On: 09/10/2018 23:04   Ct  Abdomen Pelvis W Contrast  Result Date: 09/10/2018 CLINICAL DATA:  52 year old male with concern for laryngeal cancer presents with shortness of breath and stridor. EXAM: CT ANGIOGRAPHY CHEST CT ABDOMEN AND PELVIS WITH CONTRAST TECHNIQUE: Multidetector CT imaging of the chest was performed using the standard protocol during bolus administration of intravenous contrast. Multiplanar CT image reconstructions and MIPs were obtained to evaluate the vascular anatomy. Multidetector CT imaging of the abdomen and pelvis was performed using the standard protocol during bolus administration of intravenous contrast. CONTRAST:  100mL OMNIPAQUE IOHEXOL 350 MG/ML SOLN COMPARISON:  Chest radiograph dated 09/10/2018 and neck CT dated 09/10/2018 FINDINGS: Evaluation is limited due to streak artifact caused by patient's arms. CTA CHEST FINDINGS Cardiovascular: There is no cardiomegaly or pericardial effusion. The thoracic aorta is unremarkable. There is no CT evidence of pulmonary embolism. Mediastinum/Nodes: No hilar or mediastinal adenopathy. The esophagus is grossly unremarkable. There is air in the upper mediastinum and lower neck which is new compared to the prior CT and likely related to recent tracheostomy placement. Lungs/Pleura: Minimal bibasilar subpleural atelectasis. No focal consolidation, pleural effusion, or pneumothorax. The central airways are patent. Musculoskeletal: No chest wall abnormality. No acute or significant osseous findings. Review of the MIP images confirms the above findings. CT ABDOMEN and PELVIS FINDINGS No intra-abdominal free air or free fluid. Hepatobiliary: No focal liver abnormality is seen. No gallstones, gallbladder wall thickening, or biliary dilatation. Pancreas:  Unremarkable. No pancreatic ductal dilatation or surrounding inflammatory changes. Spleen: Normal in size without focal abnormality. Adrenals/Urinary Tract: The adrenal glands are unremarkable. Probable punctate nonobstructing left renal inferior pole calculus. There is no hydronephrosis on either side. There is symmetric enhancement and excretion of contrast by both kidneys. The visualized ureters appear unremarkable. The urinary bladder is decompressed around a Foley catheter. There is air within the bladder introduced by the Foley. Stomach/Bowel: There is no bowel obstruction or active inflammation. Normal appendix. Vascular/Lymphatic: Moderate aortoiliac atherosclerotic disease. No portal venous gas. There is no adenopathy. Reproductive: The prostate and seminal vesicles are grossly unremarkable. No pelvic mass. Other: None Musculoskeletal: Irregularity of the lateral aspect of the right iliac wing, likely related to prior fracture or bone graft donation site. No acute osseous pathology. Review of the MIP images confirms the above findings. IMPRESSION: 1. No acute intrathoracic, abdominal, or pelvic pathology. No CT evidence of pulmonary embolism. 2. Air in the upper mediastinum and lower neck likely related to recent tracheostomy placement. Electronically Signed   By: Anner Crete M.D.   On: 09/10/2018 23:04   US Venous Img Lower Bilateral  Result Date: 09/10/2018 CLINICAL DATA:  Initial evaluation for lower extremity swelling, edema. EXAM: BILATERAL LOWER EXTREMITY VENOUS DOPPLER ULTRASOUND TECHNIQUE: Gray-scale sonography with graded compression, as well as color Doppler and duplex ultrasound were performed to evaluate the lower extremity deep venous systems from the level of the common femoral vein and including the common femoral, femoral, profunda femoral, popliteal and calf veins including the posterior tibial, peroneal and gastrocnemius veins when visible. The superficial great saphenous vein was  also interrogated. Spectral Doppler was utilized to evaluate flow at rest and with distal augmentation maneuvers in the common femoral, femoral and popliteal veins. COMPARISON:  None. FINDINGS: RIGHT LOWER EXTREMITY Common Femoral Vein: No evidence of thrombus. Normal compressibility, respiratory phasicity and response to augmentation. Saphenofemoral Junction: No evidence of thrombus. Normal compressibility and flow on color Doppler imaging. Profunda Femoral Vein: No evidence of thrombus. Normal compressibility and flow on color Doppler imaging. Femoral Vein: No  evidence of thrombus. Normal compressibility, respiratory phasicity and response to augmentation. Popliteal Vein: No evidence of thrombus. Normal compressibility, respiratory phasicity and response to augmentation. Calf Veins: No evidence of thrombus. Normal compressibility and flow on color Doppler imaging. Superficial Great Saphenous Vein: No evidence of thrombus. Normal compressibility. Venous Reflux:  None. Other Findings:  None. LEFT LOWER EXTREMITY Common Femoral Vein: No evidence of thrombus. Normal compressibility, respiratory phasicity and response to augmentation. Saphenofemoral Junction: No evidence of thrombus. Normal compressibility and flow on color Doppler imaging. Profunda Femoral Vein: No evidence of thrombus. Normal compressibility and flow on color Doppler imaging. Femoral Vein: No evidence of thrombus. Normal compressibility, respiratory phasicity and response to augmentation. Popliteal Vein: No evidence of thrombus. Normal compressibility, respiratory phasicity and response to augmentation. Calf Veins: No evidence of thrombus. Normal compressibility and flow on color Doppler imaging. Superficial Great Saphenous Vein: No evidence of thrombus. Normal compressibility. Venous Reflux:  None. Other Findings:  None. IMPRESSION: No evidence of deep venous thrombosis. Electronically Signed   By: Jeannine Boga M.D.   On: 09/10/2018 23:12    Dg Chest Port 1 View  Result Date: 09/10/2018 CLINICAL DATA:  Tracheostomy placement. EXAM: PORTABLE CHEST 1 VIEW COMPARISON:  Chest x-ray from earlier same day FINDINGS: Tracheostomy tube appears appropriately positioned in the midline with tip at the level of the clavicles. Heart size and mediastinal contours are within normal limits. Lungs are clear. No pleural effusion or pneumothorax seen. Osseous structures about the chest are unremarkable. IMPRESSION: Tracheostomy tube appears appropriately positioned in the midline. Lungs are clear. Electronically Signed   By: Franki Cabot M.D.   On: 09/10/2018 15:53    EKG:  No orders found for this or any previous visit.  ASSESSMENT AND PLAN:  #1. acute upper airway obstruction status post emergency tracheostomy by ENT.  Continue full vent support through trach.  Patient has CT head chest abdomen pelvis did not reveal any metastatic disease.  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  Needs [facility].  Continue IV Solu-Medrol, Bronchodilators. Try to wean off sedation as per ICU note.     Discussed with Dr. Rogue Bussing. All the records are reviewed and case discussed with Care Management/Social Workerr. Management plans discussed with the patient, family and they are in agreement.  CODE STATUS: Full code  TOTAL TIME TAKING CARE OF THIS PATIENT: 35 minutes.   POSSIBLE D/C IN 1-2 DAYS, DEPENDING ON CLINICAL CONDITION.   Epifanio Lesches M.D on 09/12/2018 at 10:11 AM  Between 7am to 6pm - Pager - [phone]  After 6pm go to [email or web address] - password [email or web address]  [facility]  Office  [phone]  CC: Primary care physician; System, Provider Not In   Note: This dictation was prepared with Dragon dictation along with smaller phrase technology. Any transcriptional errors that result from this process are unintentional.

## 2018-09-13 ENCOUNTER — Encounter: Payer: Self-pay | Admitting: Otolaryngology

## 2018-09-13 ENCOUNTER — Inpatient Hospital Stay: Payer: Medicaid Other

## 2018-09-13 DIAGNOSIS — J9601 Acute respiratory failure with hypoxia: Secondary | ICD-10-CM

## 2018-09-13 DIAGNOSIS — E440 Moderate protein-calorie malnutrition: Secondary | ICD-10-CM

## 2018-09-13 DIAGNOSIS — J387 Other diseases of larynx: Secondary | ICD-10-CM

## 2018-09-13 DIAGNOSIS — Z430 Encounter for attention to tracheostomy: Secondary | ICD-10-CM

## 2018-09-13 LAB — GLUCOSE, CAPILLARY
GLUCOSE-CAPILLARY: 147 mg/dL — AB (ref 70–99)
Glucose-Capillary: 145 mg/dL — ABNORMAL HIGH (ref 70–99)
Glucose-Capillary: 138 mg/dL — ABNORMAL HIGH (ref 70–99)
Glucose-Capillary: 120 mg/dL — ABNORMAL HIGH (ref 70–99)
Glucose-Capillary: 160 mg/dL — ABNORMAL HIGH (ref 70–99)
Glucose-Capillary: 117 mg/dL — ABNORMAL HIGH (ref 70–99)

## 2018-09-13 LAB — CBC
HCT: 42.4 % (ref 39.0–52.0)
Hemoglobin: 13.8 g/dL (ref 13.0–17.0)
MCH: 28.3 pg (ref 26.0–34.0)
MCHC: 32.5 g/dL (ref 30.0–36.0)
MCV: 86.9 fL (ref 80.0–100.0)
Platelets: 202 10*3/uL (ref 150–400)
RBC: 4.88 MIL/uL (ref 4.22–5.81)
RDW: 13.5 % (ref 11.5–15.5)
WBC: 12.3 10*3/uL — ABNORMAL HIGH (ref 4.0–10.5)
nRBC: 0 % (ref 0.0–0.2)

## 2018-09-13 LAB — PHOSPHORUS: Phosphorus: 2.7 mg/dL (ref 2.5–4.6)

## 2018-09-13 LAB — HIV ANTIBODY (ROUTINE TESTING W REFLEX): HIV Screen 4th Generation wRfx: NONREACTIVE

## 2018-09-13 LAB — TRIGLYCERIDES: TRIGLYCERIDES: 89 mg/dL (ref <150)

## 2018-09-13 LAB — MAGNESIUM: Magnesium: 2.3 mg/dL (ref 1.7–2.4)

## 2018-09-13 MED ORDER — SENNOSIDES 8.8 MG/5ML PO SYRP
5.0000 mL | ORAL_SOLUTION | Freq: Every day | ORAL | Status: DC
  Administered 2018-09-13 – 2018-09-19 (×4): 5 mL via ORAL
  Filled 2018-09-13 (×8): qty 5

## 2018-09-13 MED ORDER — PRO-STAT SUGAR FREE PO LIQD
30.0000 mL | Freq: Two times a day (BID) | ORAL | Status: DC
  Administered 2018-09-13 – 2018-09-15 (×3): 30 mL

## 2018-09-13 MED ORDER — FENTANYL CITRATE (PF) 100 MCG/2ML IJ SOLN
50.0000 ug | INTRAMUSCULAR | Status: DC | PRN
  Administered 2018-09-13: 50 ug via INTRAVENOUS
  Administered 2018-09-14 – 2018-09-15 (×7): 100 ug via INTRAVENOUS
  Filled 2018-09-13 (×8): qty 2

## 2018-09-13 MED ORDER — HYDROCODONE-ACETAMINOPHEN 5-325 MG PO TABS
1.0000 | ORAL_TABLET | ORAL | Status: DC | PRN
  Administered 2018-09-14 – 2018-09-19 (×9): 2
  Filled 2018-09-13 (×4): qty 2
  Filled 2018-09-13: qty 1
  Filled 2018-09-13 (×6): qty 2

## 2018-09-13 MED ORDER — OSMOLITE 1.5 CAL PO LIQD
1000.0000 mL | ORAL | Status: DC
  Administered 2018-09-13: 1000 mL

## 2018-09-13 MED ORDER — FUROSEMIDE 10 MG/ML IJ SOLN
20.0000 mg | Freq: Once | INTRAMUSCULAR | Status: AC
  Administered 2018-09-13: 20 mg via INTRAVENOUS
  Filled 2018-09-13: qty 2

## 2018-09-13 MED ORDER — LABETALOL HCL 5 MG/ML IV SOLN
10.0000 mg | Freq: Once | INTRAVENOUS | Status: AC
  Administered 2018-09-13: 10 mg via INTRAVENOUS
  Filled 2018-09-13: qty 4

## 2018-09-13 NOTE — Progress Notes (Signed)
Alan Chung at [place] NAME: Alan Chung    MR#:  [id number]  DATE OF BIRTH:  07-22-1966  SUBJECTIVE:   Alert this morning.  Trach in place, on trach collar this morning. No events overnight.  REVIEW OF SYSTEMS:   ROS-unable to obtain due to trach  DRUG ALLERGIES:   Allergies  Allergen Reactions  . Tramadol Itching    VITALS:  Blood pressure (!) 172/80, pulse 77, temperature 97.7 F (36.5 C), resp. rate (!) 24, height 5\' 8"  (1.727 m), weight 96.2 kg, SpO2 93 %.  PHYSICAL EXAMINATION:  GENERAL:  52 y.o.-year-old patient lying in the bed status , trach done, on trach collar EYES: Pupils equal, round, reactive to light . No scleral icterus. Extraocular muscles intact.  HEENT: Head atraumatic, normocephalic. Oropharynx and nasopharynx clear.  NECK:  Supple, no jugular venous distention. No thyroid enlargement, no tenderness. + Trach LUNGS: + Diminished breath sounds throughout, no wheezing, rales,rhonchi or crepitation. No use of accessory muscles of respiration.  CARDIOVASCULAR: RRR, S1, S2 normal. No murmurs, rubs, or gallops.  ABDOMEN: Soft, nontender, nondistended. Bowel sounds present. No organomegaly or mass.  EXTREMITIES: No pedal edema, cyanosis, or clubbing.  NEUROLOGIC: Alert, follows commands  PSYCHIATRIC: Difficult to assess  SKIN: No obvious rash, lesion, or ulcer.    LABORATORY PANEL:   CBC Recent Labs  Lab 09/13/18 0531  WBC 12.3*  HGB 13.8  HCT 42.4  PLT 202   ------------------------------------------------------------------------------------------------------------------  Chemistries  Recent Labs  Lab 09/11/18 0426 09/12/18 0423 09/13/18 0531  NA 138 137  --   K 3.8 4.5  --   CL 101 103  --   CO2 29 28  --   GLUCOSE 163* 146*  --   BUN 11 28*  --   CREATININE 0.66 0.67  --   CALCIUM 8.7* 8.4*  --   MG 1.9  --  2.3  AST 16  --   --   ALT 15  --   --   ALKPHOS 47  --   --   BILITOT 0.3   --   --    ------------------------------------------------------------------------------------------------------------------  Cardiac Enzymes Recent Labs  Lab 09/10/18 1046  TROPONINI <0.03   ------------------------------------------------------------------------------------------------------------------  RADIOLOGY:  Dg Chest Port 1 View  Result Date: 09/13/2018 CLINICAL DATA:  Acute respiratory failure EXAM: PORTABLE CHEST 1 VIEW COMPARISON:  09/10/2018 FINDINGS: NG tube enters the stomach. Tracheostomy is unchanged. Bibasilar atelectasis, increased since prior study. Heart is borderline in size. No visible effusions or acute bony abnormality. IMPRESSION: Increasing bibasilar atelectasis. Electronically Signed   By: Rolm Baptise M.D.   On: 09/13/2018 07:38   Dg Abd Portable 1v  Result Date: 09/12/2018 CLINICAL DATA:  NG tube placement. EXAM: PORTABLE ABDOMEN - 1 VIEW COMPARISON:  CT scan 09/10/2018 FINDINGS: The NG tube is coursing down the esophagus and into the stomach. It is looped back on itself with the tip in the body region. Moderate air throughout the small bowel and colon, likely diffuse ileus. IMPRESSION: NG tube tip is in the body region the stomach. Ileus bowel gas pattern. Electronically Signed   By: Marijo Sanes M.D.   On: 09/12/2018 13:46    EKG:  No orders found for this or any previous visit.  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  Mass seen on CT soft tissue of the neck. -Continue trach collar.   -  Patient has CT head chest abdomen pelvis did not reveal any metastatic disease. -Surg path pending -ENT and oncology following  -Will likely be transferred to tertiary care center for laryngectomy -Will also likely need PEG  -Steroids stopped today -Continue duonebs as needed  LE edema-stable -Lower extremity Dopplers negative for DVT -Lasix given x1  Leukocytosis- may be  reactive, improving. -Chest x-ray without infiltrate -Monitor   All the records are reviewed and case discussed with Care Management/Social Workerr. Management plans discussed with the patient, family and they are in agreement.  CODE STATUS: Full code  TOTAL TIME TAKING CARE OF THIS PATIENT: 35 minutes.   POSSIBLE D/C IN 1-2 DAYS, DEPENDING ON CLINICAL CONDITION.   [name] M.D on 09/13/2018 at 4:21 PM  Between 7am to 6pm - Pager [phone]  After 6pm go to [email or web address] - password [email or web address]  [facility]  Office  [phone]  CC: Primary care physician; System, Provider Not In   Note: This dictation was prepared with Dragon dictation along with smaller phrase technology. Any transcriptional errors that result from this process are unintentional.

## 2018-09-13 NOTE — Progress Notes (Signed)
Follow up - Critical Care Medicine Note  Patient Details:    Alan Chung is an 52 y.o. male. with a past medical history remarkable for hypertension, every day smoker, who began having symptoms of laryngitis for the past 1 to 2 months.  He was recently given a course of prednisone and antibiotics for possible laryngitis/bronchitis.  He presented to the emergency department today complaining of shortness of breath stating that the air is getting stuck in his throat.  On physical exam he was stridorous.  Dr. Pryor Ochoa saw him emergently in the emergency department.  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  He was transiently mechanically ventilated.  Now he has been mostly on trach collar.   Lines, Airways, Drains: Urethral Catheter Ova [place], RN Double-lumen 16 Fr. (Active)  Indication for Insertion or Continuance of Catheter Peri-operative use for selective surgical procedure 09/11/2018  4:15 AM  Site Assessment Clean;Intact 09/11/2018  4:15 AM  Catheter Maintenance Bag below level of bladder;Catheter secured;Drainage bag/tubing not touching floor;Insertion date on drainage bag;No dependent loops;Seal intact 09/11/2018  4:15 AM  Collection Container Standard drainage bag 09/11/2018  4:15 AM  Securement Method Securing device (Describe) 09/11/2018  4:15 AM  Urinary Catheter Interventions Unclamped 09/11/2018  4:15 AM    Anti-infectives:  Anti-infectives (From admission, onward)   None      Microbiology: Results for orders placed or performed during the hospital encounter of 09/10/18  MRSA PCR Screening     Status: None   Collection Time: 09/10/18  3:22 PM  Result Value Ref Range Status   MRSA by PCR NEGATIVE NEGATIVE Final    Comment:        The GeneXpert MRSA Assay (FDA approved for NASAL specimens only), is one component of a comprehensive MRSA colonization surveillance program. It is not intended to diagnose MRSA infection nor to guide or monitor  treatment for MRSA infections. Performed at [facility], [address] [place], [place] [address]   Culture, respiratory (non-expectorated)     Status: None (Preliminary result)   Collection Time: 09/12/18 10:50 PM  Result Value Ref Range Status   Specimen Description   Final    TRACHEAL ASPIRATE Performed at [facility], [address], [place], [place] [address]    Special Requests   Final    NONE Performed at [facility], [address] [place], [place] [address]    Gram Stain   Final    RARE WBC PRESENT, PREDOMINANTLY PMN MODERATE GRAM POSITIVE COCCI IN PAIRS FEW GRAM NEGATIVE RODS Performed at [facility], [place] [address] [place], [place] [address]    Culture PENDING  Incomplete   Report Status PENDING  Incomplete    Studies: Dg Chest 2 View  Result Date: 09/10/2018 CLINICAL DATA:  Patient with shortness of breath EXAM: CHEST - 2 VIEW COMPARISON:  Chest radiograph 08/23/2018 FINDINGS: The heart size and mediastinal contours are within normal limits. Both lungs are clear. The visualized skeletal structures are unremarkable. IMPRESSION: No active cardiopulmonary disease. Electronically Signed   By: Lovey Newcomer M.D.   On: 09/10/2018 10:48   Dg Chest 2 View  Result Date: 08/23/2018 CLINICAL DATA:  Sore throat for 2 months. Laryngitis. Nonproductive cough. Smoker. EXAM: CHEST - 2 VIEW COMPARISON:  None. FINDINGS: Normal heart size and pulmonary vascularity. No airspace disease or consolidation in the lungs. Blunting of the left costophrenic angle likely representing a small pleural effusion. No pneumothorax. Mediastinal contours appear intact.  IMPRESSION: Small left pleural effusion. No evidence of active pulmonary disease. Electronically Signed   By: Lucienne Capers M.D.   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 EXAM: CT HEAD WITHOUT  CONTRAST TECHNIQUE: Contiguous axial images were obtained from the base of the skull through the vertex without intravenous contrast. COMPARISON:  Prior neck CT from earlier the same day. FINDINGS: Brain: Cerebral volume within normal limits for age. No acute intracranial hemorrhage. No acute large vessel territory infarct. No mass lesion, midline shift or mass effect. No hydrocephalus. No extra-axial fluid collection. Vascular: No hyperdense vessel. Skull: Scalp soft tissues demonstrate no acute finding. 4.1 cm lipoma noted at the left occipital scalp. Calvarium intact. No focal osseous lesions. Sinuses/Orbits: Globes and orbital soft tissues within normal limits. Mild scattered mucosal thickening within the ethmoidal air cells. Paranasal sinuses are otherwise clear. No mastoid effusion. Other: None. IMPRESSION: 1. No acute intracranial abnormality. No appreciable intracranial mass to suggest metastatic disease. 2. 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 Electronically Signed   By: Jeannine Boga M.D.   On: 09/10/2018 23:05   Ct Soft Tissue Neck W Contrast  Addendum Date: 09/11/2018   ADDENDUM REPORT: 09/11/2018 07:32 ADDENDUM: Repeat scanning was performed at 2216 hours. The patient has undergone tracheostomy which appears satisfactory. There is expected air/gas in the soft tissues of the upper mediastinum and neck following this procedure. There is now complete obstruction at the level the hypopharynx and supraglottic airway. There is a large mass of the inferior oropharynx and supraglottic region, possibly arising from the epiglottis. Mass measures up to 5 cm in size. The lesion is bilateral but more bulky on the left. Question erosion of the upper portion of the thyroid cartilage on the left. There is enlarged level 3 node on the right axial image 61 consistent with metastatic involvement. Enlarged level 5 nodes on the left may be involved by metastatic disease, largest node short axis dimension 11  mm image 67. IMPRESSION: IMPRESSION Repeat study performed after tracheostomy. Tracheostomy appears satisfactory. Air/gas in the soft tissue planes of the upper mediastinum and neck as expected. Large irregular supraglottic and inferior oro pharyngeal mass, possibly arising from the region of the epiglottis. Mass measures up to 5 cm in size. More bulky on the left, but involving both sides. Questionable left superior thyroid cartilage erosion. Likely malignant adenopathy, level 3 on the right and level 5 on the left. Electronically Signed   By: Nelson Chimes M.D.   On: 09/11/2018 07:32   Result Date: 09/11/2018 CLINICAL DATA:  52 year old male with laryngitis symptoms for the past 1-2 months and 20 lb unintentional weight loss. EXAM: CT NECK WITH CONTRAST TECHNIQUE: Multidetector CT imaging of the neck was performed using the standard protocol following the bolus administration of intravenous contrast. CONTRAST:  31mL OMNIPAQUE IOHEXOL 300 MG/ML  SOLN COMPARISON:  Face CT 04/10/2014. FINDINGS: This exam is moderately to severely motion degraded throughout. I asked for the study to be repeated with a smaller additional contrast bolus, but the emergency department has declined this for now, the patient is being seen by ENT and possibly being intubated. Pharynx and larynx: The larynx detail is highly degraded by motion. There is abnormal bilateral laryngeal soft tissue thickening ranging from 15-25 millimeters and most pronounced at the anterior, STIR as seen on series 2, image 67. This appears to be at the level of the false  cords and AE folds, and is fairly symmetric. The epiglottis is not evaluated. The true cords on series 2, image 81 have a more normal appearance. There may be abnormal soft tissue on both sides of the thyroid cartilage, uncertain. The hypopharynx is distended with gas. The oropharynx and nasopharynx contours are within normal limits. Negative parapharyngeal and retropharyngeal spaces. Salivary  glands: Limited due to motion. No sublingual space, submandibular or parotid abnormality identified. Thyroid: Limited due to motion, no abnormality identified. Lymph nodes: Limited due to motion. There is an enlarged right level IIIa lymph nodes suspected on series 2, image 68 measuring 12 millimeters short axis. Contralateral left level 3 lymphadenopathy is possible. Bilateral level 2 lymph nodes appear more normal. No cystic or necrotic node is evident. Vascular: Detail severely degraded by motion. Grossly patent major vascular structures in the neck. Limited intracranial: Limited by motion, negative visible brain parenchyma. Visualized orbits: Negative. Mastoids and visualized paranasal sinuses: Well pneumatized aside from mild to moderate left maxillary sinus mucosal thickening. Skeleton: Limited due to motion. Multilevel degenerative changes in the cervical spine. No acute or suspicious osseous lesion identified. Upper chest: The subglottic trachea appears within normal limits. No superior mediastinal lymphadenopathy. Negative visible lung parenchyma. Other findings: Benign left occipital scalp lipoma, 39 millimeters diameter by 14 millimeters in thickness. IMPRESSION: 1. Limited by motion artifact throughout, severely so at the hypopharynx and supraglottic larynx. Patient unavailable for repeat imaging at this time per the ED. A repeat Neck CT (IV contrast preferred) is suggested when possible for improved imaging accuracy. 2. 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 Possible involvement of the bilateral thyroid cartilage. 3. Hypopharynx distended with gas raising the possibility of airway stenosis. 4. Suspected malignant right level IIIa lymph node, 12 mm short axis on series 2, image 68. Electronically Signed: By: Genevie Ann M.D. On: 09/10/2018 13:37   Ct Angio Chest Pe W Or Wo  Contrast  Result Date: 09/10/2018 CLINICAL DATA:  52 year old male with concern for laryngeal cancer presents with shortness of breath and stridor. EXAM: CT ANGIOGRAPHY CHEST CT ABDOMEN AND PELVIS WITH CONTRAST TECHNIQUE: Multidetector CT imaging of the chest was performed using the standard protocol during bolus administration of intravenous contrast. Multiplanar CT image reconstructions and MIPs were obtained to evaluate the vascular anatomy. Multidetector CT imaging of the abdomen and pelvis was performed using the standard protocol during bolus administration of intravenous contrast. CONTRAST:  27mL OMNIPAQUE IOHEXOL 350 MG/ML SOLN COMPARISON:  Chest radiograph dated 09/10/2018 and neck CT dated 09/10/2018 FINDINGS: Evaluation is limited due to streak artifact caused by patient's arms. CTA CHEST FINDINGS Cardiovascular: There is no cardiomegaly or pericardial effusion. The thoracic aorta is unremarkable. There is no CT evidence of pulmonary embolism. Mediastinum/Nodes: No hilar or mediastinal adenopathy. The esophagus is grossly unremarkable. There is air in the upper mediastinum and lower neck which is new compared to the prior CT and likely related to recent tracheostomy placement. Lungs/Pleura: Minimal bibasilar subpleural atelectasis. No focal consolidation, pleural effusion, or pneumothorax. The central airways are patent. Musculoskeletal: No chest wall abnormality. No acute or significant osseous findings. Review of the MIP images confirms the above findings. CT ABDOMEN and PELVIS FINDINGS No intra-abdominal free air or free fluid. Hepatobiliary: No focal liver abnormality is seen. No gallstones, gallbladder wall thickening, or biliary dilatation. Pancreas: Unremarkable. No pancreatic ductal dilatation or surrounding inflammatory changes. Spleen: Normal in size without focal abnormality. Adrenals/Urinary  Tract: The adrenal glands are unremarkable. Probable punctate nonobstructing left renal inferior pole  calculus. There is no hydronephrosis on either side. There is symmetric enhancement and excretion of contrast by both kidneys. The visualized ureters appear unremarkable. The urinary bladder is decompressed around a Foley catheter. There is air within the bladder introduced by the Foley. Stomach/Bowel: There is no bowel obstruction or active inflammation. Normal appendix. Vascular/Lymphatic: Moderate aortoiliac atherosclerotic disease. No portal venous gas. There is no adenopathy. Reproductive: The prostate and seminal vesicles are grossly unremarkable. No pelvic mass. Other: None Musculoskeletal: Irregularity of the lateral aspect of the right iliac wing, likely related to prior fracture or bone graft donation site. No acute osseous pathology. Review of the MIP images confirms the above findings. IMPRESSION: 1. No acute intrathoracic, abdominal, or pelvic pathology. No CT evidence of pulmonary embolism. 2. Air in the upper mediastinum and lower neck likely related to recent tracheostomy placement. Electronically Signed   By: Anner Crete M.D.   On: 09/10/2018 23:04   Ct Abdomen Pelvis W Contrast  Result Date: 09/10/2018 CLINICAL DATA:  52 year old male with concern for laryngeal cancer presents with shortness of breath and stridor. EXAM: CT ANGIOGRAPHY CHEST CT ABDOMEN AND PELVIS WITH CONTRAST TECHNIQUE: Multidetector CT imaging of the chest was performed using the standard protocol during bolus administration of intravenous contrast. Multiplanar CT image reconstructions and MIPs were obtained to evaluate the vascular anatomy. Multidetector CT imaging of the abdomen and pelvis was performed using the standard protocol during bolus administration of intravenous contrast. CONTRAST:  73mL OMNIPAQUE IOHEXOL 350 MG/ML SOLN COMPARISON:  Chest radiograph dated 09/10/2018 and neck CT dated 09/10/2018 FINDINGS: Evaluation is limited due to streak artifact caused by patient's arms. CTA CHEST FINDINGS Cardiovascular:  There is no cardiomegaly or pericardial effusion. The thoracic aorta is unremarkable. There is no CT evidence of pulmonary embolism. Mediastinum/Nodes: No hilar or mediastinal adenopathy. The esophagus is grossly unremarkable. There is air in the upper mediastinum and lower neck which is new compared to the prior CT and likely related to recent tracheostomy placement. Lungs/Pleura: Minimal bibasilar subpleural atelectasis. No focal consolidation, pleural effusion, or pneumothorax. The central airways are patent. Musculoskeletal: No chest wall abnormality. No acute or significant osseous findings. Review of the MIP images confirms the above findings. CT ABDOMEN and PELVIS FINDINGS No intra-abdominal free air or free fluid. Hepatobiliary: No focal liver abnormality is seen. No gallstones, gallbladder wall thickening, or biliary dilatation. Pancreas: Unremarkable. No pancreatic ductal dilatation or surrounding inflammatory changes. Spleen: Normal in size without focal abnormality. Adrenals/Urinary Tract: The adrenal glands are unremarkable. Probable punctate nonobstructing left renal inferior pole calculus. There is no hydronephrosis on either side. There is symmetric enhancement and excretion of contrast by both kidneys. The visualized ureters appear unremarkable. The urinary bladder is decompressed around a Foley catheter. There is air within the bladder introduced by the Foley. Stomach/Bowel: There is no bowel obstruction or active inflammation. Normal appendix. Vascular/Lymphatic: Moderate aortoiliac atherosclerotic disease. No portal venous gas. There is no adenopathy. Reproductive: The prostate and seminal vesicles are grossly unremarkable. No pelvic mass. Other: None Musculoskeletal: Irregularity of the lateral aspect of the right iliac wing, likely related to prior fracture or bone graft donation site. No acute osseous pathology. Review of the MIP images confirms the above findings. IMPRESSION: 1. No acute  intrathoracic, abdominal, or pelvic pathology. No CT evidence of pulmonary embolism. 2. Air in the upper mediastinum and lower neck likely related to recent tracheostomy placement. Electronically Signed  By: Anner Crete M.D.   On: 09/10/2018 23:04   US Venous Img Lower Bilateral  Result Date: 09/10/2018 CLINICAL DATA:  Initial evaluation for lower extremity swelling, edema. EXAM: BILATERAL LOWER EXTREMITY VENOUS DOPPLER ULTRASOUND TECHNIQUE: Gray-scale sonography with graded compression, as well as color Doppler and duplex ultrasound were performed to evaluate the lower extremity deep venous systems from the level of the common femoral vein and including the common femoral, femoral, profunda femoral, popliteal and calf veins including the posterior tibial, peroneal and gastrocnemius veins when visible. The superficial great saphenous vein was also interrogated. Spectral Doppler was utilized to evaluate flow at rest and with distal augmentation maneuvers in the common femoral, femoral and popliteal veins. COMPARISON:  None. FINDINGS: RIGHT LOWER EXTREMITY Common Femoral Vein: No evidence of thrombus. Normal compressibility, respiratory phasicity and response to augmentation. Saphenofemoral Junction: No evidence of thrombus. Normal compressibility and flow on color Doppler imaging. Profunda Femoral Vein: No evidence of thrombus. Normal compressibility and flow on color Doppler imaging. Femoral Vein: No evidence of thrombus. Normal compressibility, respiratory phasicity and response to augmentation. Popliteal Vein: No evidence of thrombus. Normal compressibility, respiratory phasicity and response to augmentation. Calf Veins: No evidence of thrombus. Normal compressibility and flow on color Doppler imaging. Superficial Great Saphenous Vein: No evidence of thrombus. Normal compressibility. Venous Reflux:  None. Other Findings:  None. LEFT LOWER EXTREMITY Common Femoral Vein: No evidence of thrombus. Normal  compressibility, respiratory phasicity and response to augmentation. Saphenofemoral Junction: No evidence of thrombus. Normal compressibility and flow on color Doppler imaging. Profunda Femoral Vein: No evidence of thrombus. Normal compressibility and flow on color Doppler imaging. Femoral Vein: No evidence of thrombus. Normal compressibility, respiratory phasicity and response to augmentation. Popliteal Vein: No evidence of thrombus. Normal compressibility, respiratory phasicity and response to augmentation. Calf Veins: No evidence of thrombus. Normal compressibility and flow on color Doppler imaging. Superficial Great Saphenous Vein: No evidence of thrombus. Normal compressibility. Venous Reflux:  None. Other Findings:  None. IMPRESSION: No evidence of deep venous thrombosis. Electronically Signed   By: Jeannine Boga M.D.   On: 09/10/2018 23:12   Dg Chest Port 1 View  Result Date: 09/13/2018 CLINICAL DATA:  Acute respiratory failure EXAM: PORTABLE CHEST 1 VIEW COMPARISON:  09/10/2018 FINDINGS: NG tube enters the stomach. Tracheostomy is unchanged. Bibasilar atelectasis, increased since prior study. Heart is borderline in size. No visible effusions or acute bony abnormality. IMPRESSION: Increasing bibasilar atelectasis. Electronically Signed   By: Rolm Baptise M.D.   On: 09/13/2018 07:38   Dg Chest Port 1 View  Result Date: 09/10/2018 CLINICAL DATA:  Tracheostomy placement. EXAM: PORTABLE CHEST 1 VIEW COMPARISON:  Chest x-ray from earlier same day FINDINGS: Tracheostomy tube appears appropriately positioned in the midline with tip at the level of the clavicles. Heart size and mediastinal contours are within normal limits. Lungs are clear. No pleural effusion or pneumothorax seen. Osseous structures about the chest are unremarkable. IMPRESSION: Tracheostomy tube appears appropriately positioned in the midline. Lungs are clear. Electronically Signed   By: Franki Cabot M.D.   On: 09/10/2018 15:53    Dg Abd Portable 1v  Result Date: 09/12/2018 CLINICAL DATA:  NG tube placement. EXAM: PORTABLE ABDOMEN - 1 VIEW COMPARISON:  CT scan 09/10/2018 FINDINGS: The NG tube is coursing down the esophagus and into the stomach. It is looped back on itself with the tip in the body region. Moderate air throughout the small bowel and colon, likely diffuse ileus. IMPRESSION: NG tube tip is in  the body region the stomach. Ileus bowel gas pattern. Electronically Signed   By: Marijo Sanes M.D.   On: 09/12/2018 13:46    Consults: Oncology/ENT/CCM  Subjective:    Overnight Issues: No significant issues overnight.  Patient went back on the ventilator transiently overnight.  He did not recall any issues with respiratory distress requiring this.  Dr. Pryor Ochoa evaluated the patient today and removed tracheal sutures.   Objective:  Vital signs for last 24 hours: Temp:  [97.7 F (36.5 C)-99.2 F (37.3 C)] 97.7 F (36.5 C) (12/09 0800) Pulse Rate:  [62-84] 77 (12/09 1440) Resp:  [9-30] 24 (12/09 1440) BP: (132-180)/(71-99) 172/80 (12/09 1300) SpO2:  [89 %-98 %] 93 % (12/09 1440) FiO2 (%):  [28 %] 28 % (12/09 1440)  Hemodynamic parameters for last 24 hours:    Intake/Output from previous day: 12/08 0701 - 12/09 0700 In: 2063.5 [I.V.:2063.5] Out: 2445 [Urine:1845; Emesis/NG output:600]  Intake/Output this shift: Total I/O In: 378.3 [I.V.:378.3] Out: 2051 [Urine:2000; Emesis/NG output:50; Stool:1]  Vent settings for last 24 hours: FiO2 (%):  [28 %] 28 %  Physical Exam:  Patient comfortable on trach collar, awake alert follows commands. Vital signs:       Please see the above listed vital signs HEENT:           Patient with tracheostomy in place, some redness around the tracheal stoma, sutures removed by Dr. Pryor Ochoa.   CV:                   Regular rate and rhythm Pulmonary:      Coarse expiratory rhonchi appreciated bilaterally no wheezes. Abdominal:      Positive bowel sounds, soft on  exam Extremities:     Patient with prior surgery on right lower extremity.    Extremity edema 2+ Neurologic:      Awake alert, follows commands readily.  Focal deficits noted. Assessment/Plan:   Acute upper airway obstruction.  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  CT scan of head chest abdomen and pelvis did not reveal any clear evidence of metastatic disease.  Continue bronchodilators.  Discontinue steroids due to poor glycemic control. Appreciate oncology and ENT patient will most likely need to be transferred to tertiary center for laryngeal resection, he will likely need PEG placement.  Asymmetric lower extremity edema.    Dopplers of the lower extremities were negative  Prerenal azotemia.  To need to monitor, he does have evidence of volume overload, Lasix x1.  Leukocytosis.  Will recheck chest x-ray and obtain sputum culture  Anemia.  No evidence of bleeding   Vernard Gambles 09/13/2018

## 2018-09-13 NOTE — Progress Notes (Addendum)
.. 09/13/2018 8:22 AM  Alan Chung [id number]  Post-Op Day 3    Temp:  [97.9 F (36.6 C)-99.2 F (37.3 C)] 99.2 F (37.3 C) (12/09 0600) Pulse Rate:  [62-84] 68 (12/09 0700) Resp:  [8-22] 17 (12/09 0700) BP: (122-167)/(66-93) 149/90 (12/09 0700) SpO2:  [90 %-98 %] 94 % (12/09 0700) FiO2 (%):  [28 %-30 %] 28 % (12/09 0400),     Intake/Output Summary (Last 24 hours) at 09/13/2018 1027 Last data filed at 09/13/2018 0603 Gross per 24 hour  Intake 2063.47 ml  Output 2445 ml  Net -381.53 ml    Results for orders placed or performed during the hospital encounter of 09/10/18 (from the past 24 hour(s))  Glucose, capillary     Status: Abnormal   Collection Time: 09/12/18 11:32 AM  Result Value Ref Range   Glucose-Capillary 145 (H) 70 - 99 mg/dL  Glucose, capillary     Status: Abnormal   Collection Time: 09/12/18  3:59 PM  Result Value Ref Range   Glucose-Capillary 130 (H) 70 - 99 mg/dL  Glucose, capillary     Status: Abnormal   Collection Time: 09/12/18  7:44 PM  Result Value Ref Range   Glucose-Capillary 111 (H) 70 - 99 mg/dL   Comment 1 Notify RN    Comment 2 Document in Chart   Culture, respiratory (non-expectorated)     Status: None (Preliminary result)   Collection Time: 09/12/18 10:50 PM  Result Value Ref Range   Specimen Description      TRACHEAL ASPIRATE Performed at [facility], [address], [place], [place] [address]    Special Requests      NONE Performed at [facility], [place], [place] [address]    Gram Stain      RARE WBC PRESENT, PREDOMINANTLY PMN MODERATE [place] Performed at [facility], [place] [address] [place], [place] [address]    Culture PENDING    Report Status PENDING   Glucose, capillary     Status: Abnormal   Collection Time: 09/12/18 11:46 PM  Result Value Ref Range   Glucose-Capillary 126 (H) 70 - 99 mg/dL   Comment 1 Notify RN    Comment 2  Document in Chart   Glucose, capillary     Status: Abnormal   Collection Time: 09/13/18  4:24 AM  Result Value Ref Range   Glucose-Capillary 147 (H) 70 - 99 mg/dL   Comment 1 Notify RN    Comment 2 Document in Chart   Magnesium     Status: None   Collection Time: 09/13/18  5:31 AM  Result Value Ref Range   Magnesium 2.3 1.7 - 2.4 mg/dL  Phosphorus     Status: None   Collection Time: 09/13/18  5:31 AM  Result Value Ref Range   Phosphorus 2.7 2.5 - 4.6 mg/dL  CBC     Status: Abnormal   Collection Time: 09/13/18  5:31 AM  Result Value Ref Range   WBC 12.3 (H) 4.0 - 10.5 K/uL   RBC 4.88 4.22 - 5.81 MIL/uL   Hemoglobin 13.8 13.0 - 17.0 g/dL   HCT 42.4 39.0 - 52.0 %   MCV 86.9 80.0 - 100.0 fL   MCH 28.3 26.0 - 34.0 pg   MCHC 32.5 30.0 - 36.0 g/dL   RDW 13.5 11.5 - 15.5 %   Platelets 202 150 - 400 K/uL   nRBC 0.0 0.0 - 0.2 %  Glucose, capillary     Status: Abnormal   Collection Time: 09/13/18  7:50 AM  Result Value Ref Range   Glucose-Capillary 145 (H) 70 - 99 mg/dL    SUBJECTIVE:  No acute events.  NG tube placed.  Patient resting comfortably.  On trach collar currently  OBJECTIVE:  NECK-  Trach secure and dressing with serosanguinous drainage.  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  Will discuss today with [facility] regarding evaluation for laryngectomy.  Plan on change to cuffless trach on POD#5.    Alan Chung 09/13/2018, 8:22 AM

## 2018-09-13 NOTE — Progress Notes (Signed)
SLP Cancellation Note  Patient Details Name: Judah Carchi MRN: [id number] DOB: 01-04-1966   Cancelled treatment:       Reason Eval/Treat Not Completed: Patient not medically ready;Medical issues which prohibited therapy(chart reviewed; consulted MD(Pulmonology) in CCU).  Per CT Soft Tissue of the Neck, pt has a "Large irregular supraglottic and inferior oro pharyngeal mass, possibly arising from the region of the epiglottis. Mass measures up to 5 cm in size. More bulky on the left, but involving both sides.". Suspect as pt was having airway compromise necessitating a tracheostomy, using a one-way speaking valve at this time would not be successful or beneficial for pt d/t his supraglottic airway restriction. Also discussed performing a MBSS at this time as ordered; MD stated to hold on this currently. ST services will f/u next 1-2 days for potential swallowing assessment d/t the fact that pt has been drinking liquids (min per report) and he admitted w/ lungs clear per CXR. NSG updated. MD will f/u w/ pt/family discussion re: ST intervention at this time. Recommend frequent oral care d/t NPO status.   Orinda Kenner, MS, CCC-SLP Watson,Katherine 09/13/2018, 2:13 PM

## 2018-09-13 NOTE — Progress Notes (Signed)
   09/13/18 1100  Clinical Encounter Type  Visited With Patient not available;Patient (Patient receiving care.)  Spiritual Encounters  Spiritual Needs  (Chaplain offered silent, energetic prayer.)

## 2018-09-14 ENCOUNTER — Other Ambulatory Visit: Payer: Self-pay | Admitting: Pathology

## 2018-09-14 ENCOUNTER — Inpatient Hospital Stay: Payer: Medicaid Other

## 2018-09-14 ENCOUNTER — Encounter: Payer: Self-pay | Admitting: Otolaryngology

## 2018-09-14 LAB — BASIC METABOLIC PANEL
Anion gap: 7 (ref 5–15)
BUN: 21 mg/dL — ABNORMAL HIGH (ref 6–20)
CALCIUM: 8.2 mg/dL — AB (ref 8.9–10.3)
CO2: 26 mmol/L (ref 22–32)
Chloride: 107 mmol/L (ref 98–111)
Creatinine, Ser: 0.57 mg/dL — ABNORMAL LOW (ref 0.61–1.24)
GFR calc Af Amer: >60 mL/min (ref >60)
GFR calc non Af Amer: >60 mL/min (ref >60)
Glucose, Bld: 144 mg/dL — ABNORMAL HIGH (ref 70–99)
Potassium: 3.5 mmol/L (ref 3.5–5.1)
Sodium: 140 mmol/L (ref 135–145)

## 2018-09-14 LAB — GLUCOSE, CAPILLARY
GLUCOSE-CAPILLARY: 121 mg/dL — AB (ref 70–99)
Glucose-Capillary: 107 mg/dL — ABNORMAL HIGH (ref 70–99)
Glucose-Capillary: 110 mg/dL — ABNORMAL HIGH (ref 70–99)
Glucose-Capillary: 120 mg/dL — ABNORMAL HIGH (ref 70–99)
Glucose-Capillary: 137 mg/dL — ABNORMAL HIGH (ref 70–99)
Glucose-Capillary: 89 mg/dL (ref 70–99)

## 2018-09-14 LAB — SURGICAL PATHOLOGY

## 2018-09-14 LAB — PHOSPHORUS: Phosphorus: 2.4 mg/dL — ABNORMAL LOW (ref 2.5–4.6)

## 2018-09-14 LAB — MAGNESIUM: Magnesium: 2.1 mg/dL (ref 1.7–2.4)

## 2018-09-14 MED ORDER — SODIUM CHLORIDE 0.9 % IV SOLN
3.0000 g | Freq: Four times a day (QID) | INTRAVENOUS | Status: DC
  Administered 2018-09-14 – 2018-09-17 (×14): 3 g via INTRAVENOUS
  Filled 2018-09-14 (×17): qty 3

## 2018-09-14 MED ORDER — LIDOCAINE HCL URETHRAL/MUCOSAL 2 % EX GEL
1.0000 "application " | Freq: Once | CUTANEOUS | Status: DC
  Filled 2018-09-14 (×2): qty 5

## 2018-09-14 MED ORDER — IPRATROPIUM-ALBUTEROL 0.5-2.5 (3) MG/3ML IN SOLN: 3.0000 mL | Freq: Four times a day (QID) | RESPIRATORY_TRACT | Status: DC | PRN

## 2018-09-14 MED ORDER — IPRATROPIUM-ALBUTEROL 0.5-2.5 (3) MG/3ML IN SOLN
3.0000 mL | Freq: Three times a day (TID) | RESPIRATORY_TRACT | Status: DC
  Administered 2018-09-14 – 2018-09-20 (×16): 3 mL via RESPIRATORY_TRACT
  Filled 2018-09-14 (×16): qty 3

## 2018-09-14 MED ORDER — HYDRALAZINE HCL 20 MG/ML IJ SOLN
10.0000 mg | INTRAMUSCULAR | Status: DC | PRN
  Administered 2018-09-14 – 2018-09-15 (×2): 10 mg via INTRAVENOUS
  Filled 2018-09-14 (×2): qty 1

## 2018-09-14 NOTE — Progress Notes (Signed)
Patient suctioned without complication, RN at bedside as well for education. Inner canula changed and dressing applied. Patient has moderate amount of tan secretions. Patient has strong productive cough. Will continue to monitor. All equipment and check list at bedside at this time.

## 2018-09-14 NOTE — Care Management Note (Addendum)
Case Management Note  Patient Details  Name: Alan Chung MRN: [id number] Date of Birth: 18-Aug-1966  Subjective/Objective:    RNCM was able to schedule new patient appointment with [facility] in [place] to set up PCP services for Tuesday Dec. 17 th at 9:20 am.  Patient reports that this time will be acceptable and his brother can take him to the appointment.  Clinic is sliding scale and will require copayment.  Patient thinks that he will be able to pay $25 dollar copay.  [facility] clinic requests discharge summary be faxed to [phone] at discharge.  Number for Clinic in [place] is [phone].   RNCM attempted to schedule PCP appointment in [place] but there were no openings for new patient's until after January 28th 2020.  RNCM will cont to follow Doran Clay RN BSN  [phone]                 Action/Plan:   Expected Discharge Date:                  Expected Discharge Plan:  [place]  In-House Referral:     Discharge planning Services  CM Consult  Post Acute Care Choice:    Choice offered to:     DME Arranged:    DME Agency:     HH Arranged:    [place] Agency:     Status of Service:  In process, will continue to follow  If discussed at Long Length of Stay Meetings, dates discussed:    Additional Comments:  Shelbie Hutching, RN 09/14/2018, 2:35 PM

## 2018-09-14 NOTE — Progress Notes (Signed)
Pharmacy Antibiotic Note  Alan Chung is a 52 y.o. male admitted on 09/10/2018 with aspiration pneumonia.  Pharmacy has been consulted for Unasyn dosing.  Plan: Will start Unasyn 3g IV q6h  Height: 5\' 8"  (172.7 cm) Weight: 212 lb 1.3 oz (96.2 kg) IBW/kg (Calculated) : 68.4  Temp (24hrs), Avg:98.9 F (37.2 C), Min:97.7 F (36.5 C), Max:99.7 F (37.6 C)  Recent Labs  Lab 09/10/18 1046 09/11/18 0426 09/12/18 0423 09/13/18 0531  WBC 9.2 10.8* 15.4* 12.3*  CREATININE 0.73 0.66 0.67  --     Estimated Creatinine Clearance: 121.5 mL/min (by C-G formula based on SCr of 0.67 mg/dL).    Allergies  Allergen Reactions  . Tramadol Itching    Thank you for allowing pharmacy to be a part of this patient's care.  Tobie Lords, PharmD, BCPS Clinical Pharmacist 09/14/2018

## 2018-09-14 NOTE — Treatment Plan (Signed)
Patient has been stable overnight and has not required mechanical ventilation.  Tracheostomy in place.  His only issue for night was some nausea and vomiting.  Tube feeds held.  He will need postpyloric jejunal placement of a feeding tube.  He has been hemodynamically stable and from our standpoint can transfer out of the ICU to the oncology unit.  Discussed at multidisciplinary rounds.  Case management assisting.  Marquette Saa PCCM

## 2018-09-14 NOTE — Progress Notes (Addendum)
[place] [facility] [place] NAME: Alan Chung    MR#:  [id number]  DATE OF BIRTH:  1966/02/13  SUBJECTIVE:    Did have some nausea and vomiting overnight.  Doing well this morning.  Stable on trach collar.  REVIEW OF SYSTEMS:   ROS-unable to obtain due to trach  DRUG ALLERGIES:   Allergies  Allergen Reactions  . Tramadol Itching    VITALS:  Blood pressure (!) 157/87, pulse 76, temperature 97.7 F (36.5 C), temperature source Oral, resp. rate (!) 22, height 6\' 1"  (1.854 m), weight 90.8 kg, SpO2 100 %.  PHYSICAL EXAMINATION:  GENERAL:  52 y.o.-year-old patient lying in the bed, in NAD EYES: Pupils equal, round, reactive to light . No scleral icterus. Extraocular muscles intact.  HEENT: Head atraumatic, normocephalic. Oropharynx and nasopharynx clear.  NECK:  Supple, no jugular venous distention. No thyroid enlargement, no tenderness. + Trach LUNGS: + Diminished breath sounds throughout, no wheezing, rales,rhonchi or crepitation. No use of accessory muscles of respiration.  CARDIOVASCULAR: RRR, S1, S2 normal. No murmurs, rubs, or gallops.  ABDOMEN: Soft, nontender, nondistended. Bowel sounds present. No organomegaly or mass.  EXTREMITIES: No pedal edema, cyanosis, or clubbing.  NEUROLOGIC: Alert, follows commands  PSYCHIATRIC: Difficult to assess  SKIN: No obvious rash, lesion, or ulcer.   LABORATORY PANEL:   CBC Recent Labs  Lab 09/13/18 0531  WBC 12.3*  HGB 13.8  HCT 42.4  PLT 202   ------------------------------------------------------------------------------------------------------------------  Chemistries  Recent Labs  Lab 09/11/18 0426  09/14/18 0526  NA 138   < > 140  K 3.8   < > 3.5  CL 101   < > 107  CO2 29   < > 26  GLUCOSE 163*   < > 144*  BUN 11   < > 21*  CREATININE 0.66   < > 0.57*  CALCIUM 8.7*   < > 8.2*  MG 1.9   < > 2.1  AST 16  --   --   ALT 15  --   --   ALKPHOS 47  --   --   BILITOT 0.3   --   --    < > = values in this interval not displayed.   ------------------------------------------------------------------------------------------------------------------  Cardiac Enzymes Recent Labs  Lab 09/10/18 1046  TROPONINI <0.03   ------------------------------------------------------------------------------------------------------------------  RADIOLOGY:  Dg Abd 1 View  Result Date: 09/13/2018 CLINICAL DATA:  52 y/o M; aspiration into airway. NG tube placement. EXAM: PORTABLE CHEST 1 VIEW ABDOMEN RADIOGRAPH, 1 VIEW COMPARISON:  09/13/2018 chest radiograph. 09/12/2018 abdomen radiograph. FINDINGS: Chest: Stable cardiac silhouette given projection and technique within normal limits. Tracheostomy tube projecting over trachea. Left basilar ill-defined retrocardiac opacity. No pleural effusion or pneumothorax. Enteric tube tip extends below the field of view into the abdomen. No acute osseous abnormality identified. Abdomen: Enteric tube tip projects over gastric body. Upper abdominal bowel gas pattern is normal. IMPRESSION: Enteric tube tip projects over gastric body. Ill-defined left basilar opacity may represent atelectasis, pneumonia, or aspiration. Electronically Signed   By: Kristine Garbe M.D.   On: 09/13/2018 22:06   Dg Chest Port 1 View  Result Date: 09/13/2018 CLINICAL DATA:  52 y/o M; aspiration into airway. NG tube placement. EXAM: PORTABLE CHEST 1 VIEW ABDOMEN RADIOGRAPH, 1 VIEW COMPARISON:  09/13/2018 chest radiograph. 09/12/2018 abdomen radiograph. FINDINGS: Chest: Stable cardiac silhouette given projection and technique within normal limits. Tracheostomy tube projecting over trachea. Left basilar ill-defined retrocardiac opacity. No  pleural effusion or pneumothorax. Enteric tube tip extends below the field of view into the abdomen. No acute osseous abnormality identified. Abdomen: Enteric tube tip projects over gastric body. Upper abdominal bowel gas pattern is  normal. IMPRESSION: Enteric tube tip projects over gastric body. Ill-defined left basilar opacity may represent atelectasis, pneumonia, or aspiration. Electronically Signed   By: Kristine Garbe M.D.   On: 09/13/2018 22:06   Dg Chest Port 1 View  Result Date: 09/13/2018 CLINICAL DATA:  Acute respiratory failure EXAM: PORTABLE CHEST 1 VIEW COMPARISON:  09/10/2018 FINDINGS: NG tube enters the stomach. Tracheostomy is unchanged. Bibasilar atelectasis, increased since prior study. Heart is borderline in size. No visible effusions or acute bony abnormality. IMPRESSION: Increasing bibasilar atelectasis. Electronically Signed   By: Rolm Baptise M.D.   On: 09/13/2018 07:38    EKG:  No orders found for this or any previous visit.  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  Mass seen on CT soft tissue of the neck.  CT head/chest/abdomen/pelvis negative for mets. -Continue trach collar -Surg path pending -ENT and oncology following  -Patient will need PEG- GI has been consulted -Continue duonebs as needed -Plan for likely discharge home and follow-up at [facility]  Aspiration pneumonia-chest x-ray with left basilar opacity -Continue Unasyn -Check chest x-ray in the morning -Duonebs as needed  Nausea/vomiting-abdominal exam benign. AXR with gas pattern. -Tube feeds held  LE edema-stable -Lower extremity Dopplers negative for DVT -Lasix given x1  Hyperglycemia-blood sugars mildly elevated, no diagnosis of diabetes. -Check A1c -Continue sliding scale insulin  All the records are reviewed and case discussed with Care Management/Social Workerr. Management plans discussed with the patient, family and they are in agreement.  CODE STATUS: Full code  TOTAL TIME TAKING CARE OF THIS PATIENT: 32 minutes.   POSSIBLE D/C IN 1-2 DAYS, DEPENDING ON CLINICAL CONDITION.   Berna Spare Shaquandra Galano M.D on 09/14/2018 at 5:03  PM  Between 7am to 6pm - Pager [phone]  After 6pm go to [email or web address] - password [email or web address]  [facility]  Office  [phone]  CC: Primary care physician; System, Provider Not In   Note: This dictation was prepared with Dragon dictation along with smaller phrase technology. Any transcriptional errors that result from this process are unintentional.

## 2018-09-14 NOTE — Progress Notes (Signed)
Report called to [facility], RN on 1C.  Patient will be moved to room 116.

## 2018-09-14 NOTE — Care Management Note (Addendum)
Case Management Note  Patient Details  Name: Alan Chung MRN: [id number] Date of Birth: 03-31-1966  Subjective/Objective:  Patient admitted to the ICU after emergent trach for laryngeal mass.  Patient is doing well, awake and alert, writing to communicate, brother Alan Chung at the bedside.  Patient tolerating trach collar and is floor status.  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  Patient has no insurance and no PCP, he is agreeable to having home health for help with new trach.  Patient will need to follow up with [facility] as an outpatient per ENT note.   Patient given information for [facility]/MM and application for patient to fill out. Patient also given a list of other [employer] including other indigent clinics in the area.  RNCM will attempt to set up PCP services.  Patient is okay with using [facility] for [place].  RNCM will cont to follow for discharge planning needs.  Doran Clay RN BSN [phone]                   Action/Plan:   Expected Discharge Date:                  Expected Discharge Plan:     In-House Referral:     Discharge planning Services  CM Consult  Post Acute Care Choice:    Choice offered to:     DME Arranged:    DME Agency:     HH Arranged:    HH Agency:     Status of Service:  In process, will continue to follow  If discussed at Long Length of Stay Meetings, dates discussed:    Additional Comments:  Shelbie Hutching, RN 09/14/2018, 12:21 PM

## 2018-09-14 NOTE — Progress Notes (Signed)
.. 09/14/2018 8:00 AM  Shelly Flatten [id number]  Post-Op Day 4    Temp:  [98.9 F (37.2 C)-99.7 F (37.6 C)] 98.9 F (37.2 C) (12/10 0400) Pulse Rate:  [67-91] 67 (12/10 0700) Resp:  [15-32] 23 (12/10 0700) BP: (134-185)/(61-96) 169/90 (12/10 0700) SpO2:  [89 %-97 %] 94 % (12/10 0700) FiO2 (%):  [28 %-32 %] 30 % (12/10 0400),     Intake/Output Summary (Last 24 hours) at 09/14/2018 0800 Last data filed at 09/14/2018 0600 Gross per 24 hour  Intake 1140.5 ml  Output 2672 ml  Net -1531.5 ml    Results for orders placed or performed during the hospital encounter of 09/10/18 (from the past 24 hour(s))  Glucose, capillary     Status: Abnormal   Collection Time: 09/13/18  8:46 AM  Result Value Ref Range   Glucose-Capillary 138 (H) 70 - 99 mg/dL  Glucose, capillary     Status: Abnormal   Collection Time: 09/13/18 11:39 AM  Result Value Ref Range   Glucose-Capillary 120 (H) 70 - 99 mg/dL  Triglycerides     Status: None   Collection Time: 09/13/18  3:14 PM  Result Value Ref Range   Triglycerides 89 <150 mg/dL  Glucose, capillary     Status: Abnormal   Collection Time: 09/13/18  4:07 PM  Result Value Ref Range   Glucose-Capillary 160 (H) 70 - 99 mg/dL  Glucose, capillary     Status: Abnormal   Collection Time: 09/13/18  7:44 PM  Result Value Ref Range   Glucose-Capillary 117 (H) 70 - 99 mg/dL  Glucose, capillary     Status: Abnormal   Collection Time: 09/14/18 12:16 AM  Result Value Ref Range   Glucose-Capillary 107 (H) 70 - 99 mg/dL  Glucose, capillary     Status: Abnormal   Collection Time: 09/14/18  3:56 AM  Result Value Ref Range   Glucose-Capillary 137 (H) 70 - 99 mg/dL  Basic metabolic panel     Status: Abnormal   Collection Time: 09/14/18  5:26 AM  Result Value Ref Range   Sodium 140 135 - 145 mmol/L   Potassium 3.5 3.5 - 5.1 mmol/L   Chloride 107 98 - 111 mmol/L   CO2 26 22 - 32 mmol/L   Glucose, Bld 144 (H) 70 - 99 mg/dL   BUN 21 (H) 6 - 20 mg/dL   Creatinine, Ser 0.57 (L) 0.61 - 1.24 mg/dL   Calcium 8.2 (L) 8.9 - 10.3 mg/dL   GFR calc non Af Amer >60 >60 mL/min   GFR calc Af Amer >60 >60 mL/min   Anion gap 7 5 - 15  Phosphorus     Status: Abnormal   Collection Time: 09/14/18  5:26 AM  Result Value Ref Range   Phosphorus 2.4 (L) 2.5 - 4.6 mg/dL  Magnesium     Status: None   Collection Time: 09/14/18  5:26 AM  Result Value Ref Range   Magnesium 2.1 1.7 - 2.4 mg/dL  Glucose, capillary     Status: Abnormal   Collection Time: 09/14/18  7:37 AM  Result Value Ref Range   Glucose-Capillary 120 (H) 70 - 99 mg/dL    SUBJECTIVE:  NAD  OBJECTIVE:  GEN-  NAD, resting NECK-  Trach secure and in place and patent on trach collar,  Moderate saliva and draiange  IMPRESSION:  S/p emergent trach due to airway obstruction for 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 Ihor Austin at [facility] who is reviewing CT scan.  Pathology is still pending and will contact them to see if we have a preliminary result to proceed.  3)  Recommend proceeding with PEG and discussed with patient this morning  4)  Most likely patient will be discharged and follow up at [facility] as outpatient instead of inpatient transfer, so home/rehab discharge planning will need to be done  [facility] 09/14/2018, 8:00 AM

## 2018-09-14 NOTE — Progress Notes (Signed)
Spoke with Darel Hong, NP and made him aware that blood pressure is 343'H systolic and 68-616 diastolic with no PO access until IR places tube.  NP stated he would order IV med.

## 2018-09-14 NOTE — Progress Notes (Signed)
Patient moved to room 116 by bed with Chasity, NT.  Patient on venti mask for transfer.  Alert with no distress noted when leaving ICU.

## 2018-09-15 ENCOUNTER — Inpatient Hospital Stay: Payer: Medicaid Other

## 2018-09-15 DIAGNOSIS — Z931 Gastrostomy status: Secondary | ICD-10-CM

## 2018-09-15 DIAGNOSIS — C329 Malignant neoplasm of larynx, unspecified: Secondary | ICD-10-CM

## 2018-09-15 DIAGNOSIS — E440 Moderate protein-calorie malnutrition: Secondary | ICD-10-CM

## 2018-09-15 LAB — BASIC METABOLIC PANEL
Anion gap: 8 (ref 5–15)
BUN: 18 mg/dL (ref 6–20)
CO2: 22 mmol/L (ref 22–32)
Calcium: 8.4 mg/dL — ABNORMAL LOW (ref 8.9–10.3)
Chloride: 107 mmol/L (ref 98–111)
Creatinine, Ser: 0.53 mg/dL — ABNORMAL LOW (ref 0.61–1.24)
GFR calc Af Amer: >60 mL/min (ref >60)
GFR calc non Af Amer: >60 mL/min (ref >60)
GLUCOSE: 111 mg/dL — AB (ref 70–99)
Potassium: 3.4 mmol/L — ABNORMAL LOW (ref 3.5–5.1)
Sodium: 137 mmol/L (ref 135–145)

## 2018-09-15 LAB — GLUCOSE, CAPILLARY
GLUCOSE-CAPILLARY: 105 mg/dL — AB (ref 70–99)
GLUCOSE-CAPILLARY: 110 mg/dL — AB (ref 70–99)
GLUCOSE-CAPILLARY: 98 mg/dL (ref 70–99)
Glucose-Capillary: 96 mg/dL (ref 70–99)
Glucose-Capillary: 95 mg/dL (ref 70–99)
Glucose-Capillary: 81 mg/dL (ref 70–99)

## 2018-09-15 LAB — CULTURE, RESPIRATORY W GRAM STAIN

## 2018-09-15 LAB — CBC
HCT: 41.9 % (ref 39.0–52.0)
Hemoglobin: 13.9 g/dL (ref 13.0–17.0)
MCH: 28.1 pg (ref 26.0–34.0)
MCHC: 33.2 g/dL (ref 30.0–36.0)
MCV: 84.8 fL (ref 80.0–100.0)
Platelets: 172 10*3/uL (ref 150–400)
RBC: 4.94 MIL/uL (ref 4.22–5.81)
RDW: 13.2 % (ref 11.5–15.5)
WBC: 9.5 10*3/uL (ref 4.0–10.5)
nRBC: 0 % (ref 0.0–0.2)

## 2018-09-15 LAB — HEMOGLOBIN A1C
Hgb A1c MFr Bld: 5.5 % (ref 4.8–5.6)
Mean Plasma Glucose: 111.15 mg/dL

## 2018-09-15 MED ORDER — HYDROMORPHONE HCL 1 MG/ML IJ SOLN
2.0000 mg | INTRAMUSCULAR | Status: DC | PRN
  Administered 2018-09-15 – 2018-09-20 (×30): 2 mg via INTRAVENOUS
  Filled 2018-09-15 (×30): qty 2

## 2018-09-15 MED ORDER — POTASSIUM CHLORIDE 10 MEQ/100ML IV SOLN
10.0000 meq | INTRAVENOUS | Status: AC
  Administered 2018-09-15 (×2): 10 meq via INTRAVENOUS
  Filled 2018-09-15 (×2): qty 100

## 2018-09-15 MED ORDER — LACTATED RINGERS IV SOLN: INTRAVENOUS | Status: DC

## 2018-09-15 MED ORDER — HYDROMORPHONE HCL 1 MG/ML IJ SOLN
1.0000 mg | INTRAMUSCULAR | Status: DC | PRN
  Administered 2018-09-15: 1 mg via INTRAVENOUS
  Filled 2018-09-15: qty 1

## 2018-09-15 NOTE — Consult Note (Signed)
Lucilla Lame, MD [facility]  [address] [place], [place] [address] Phone: [phone] Fax : [phone]  Consultation  Referring Provider:     Dr. Brett Albino Primary Care Physician:  System, Provider Not In Primary Gastroenterologist: Althia Forts         Reason for Consultation:     Poor p.o. intake  Date of Admission:  09/10/2018 Date of Consultation:  09/15/2018         HPI:   Alan Chung is a 52 y.o. 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  The patient had undergone a tracheostomy by ENT and was unable to eat and therefore it was recommended that the patient undergo a PEG tube placement.  The patient is not able to give any significant history and the chart review has reported that patient to be seen for shortness of breath with the thought of COPD with possible laryngitis.  The patient had been having symptoms for approximately 2 months.  The patient was treated with steroids and antibiotics.  As stated above I am now being asked to see the patient for PEG tube placement.  Past Medical History:  Diagnosis Date  . Hypertension     Past Surgical History:  Procedure Laterality Date  . DIAGNOSTIC LARYNGOSCOPY  09/10/2018   Procedure: DIAGNOSTIC LARYNGOSCOPY WITH BIOPSY;  Surgeon: Carloyn Manner, MD;  Location: [facility] ORS;  Service: ENT;;  . right ankle surgery    . TRACHEOSTOMY TUBE PLACEMENT N/A 09/10/2018   Procedure: TRACHEOSTOMY;  Surgeon: Carloyn Manner, MD;  Location: [facility] ORS;  Service: ENT;  Laterality: N/A;    Prior to Admission medications   Medication Sig Start Date End Date Taking? Authorizing Provider  albuterol (PROVENTIL HFA;VENTOLIN HFA) 108 (90 Base) MCG/ACT inhaler Inhale 2 puffs into the lungs every 6 (six) hours as needed for wheezing or shortness of breath. 08/24/18   Lannie Fields, PA-C  ibuprofen (ADVIL,MOTRIN) 800 MG tablet Take 800 mg by mouth every 8 (eight) hours as needed.    [provider]  predniSONE (DELTASONE) 50 MG tablet Take one 50 mg tablet once daily for the next five days. 08/24/18   Lannie Fields, PA-C    History reviewed. No pertinent family history.   Social History   Tobacco Use  . Smoking status: Current Every Day Smoker  . Smokeless tobacco: Never Used  Substance Use Topics  . Alcohol use: No  . Drug use: No    Allergies as of 09/10/2018 - Review Complete 09/10/2018  Allergen Reaction Noted  . Tramadol Itching 03/04/2015    Review of Systems:    All systems reviewed and negative except where noted in HPI.   Physical Exam:  Vital signs in last 24 hours: Temp:  [97.7 F (36.5 C)-98.9 F (37.2 C)] 98.9 F (37.2 C) (12/11 0457) Pulse Rate:  [66-76] 66 (12/11 0457) Resp:  [20-22] 20 (12/11 0457) BP: (152-210)/(76-98) 152/76 (12/11 0530) SpO2:  [97 %-100 %] 97 % (12/11 0756) FiO2 (%):  [28 %] 28 % (12/11 0756) Weight:  [90.3 kg-90.8 kg] 90.3 kg (12/11 0629) Last BM Date: 09/14/18 General:   Pleasant, cooperative in NAD Head:  Normocephalic and atraumatic. Eyes:   No icterus.   Conjunctiva pink. PERRLA. Ears:  Normal auditory acuity. Neck:  Supple; no masses or thyroidomegaly.  The patient has a tracheostomy in place. Lungs: Respirations even and unlabored. Lungs clear to auscultation bilaterally.   No wheezes, crackles, or rhonchi.  Heart:  Regular rate and rhythm;  Without murmur, clicks, rubs or gallops Abdomen:  Soft, nondistended, nontender. Normal bowel sounds. No appreciable masses or hepatomegaly.  No rebound or guarding.  Rectal:  Not performed. Msk:  Symmetrical without gross deformities.    Extremities:  Without edema, cyanosis or clubbing. Neurologic:  Alert and oriented x3;  grossly normal neurologically. Skin:  Intact without significant lesions or rashes. Cervical Nodes:  No significant cervical adenopathy. Psych:  Alert and cooperative. Normal affect.  LAB RESULTS: Recent Labs    09/13/18 0531  09/15/18 0446  WBC 12.3* 9.5  HGB 13.8 13.9  HCT 42.4 41.9  PLT 202 172   BMET Recent Labs    09/14/18 0526 09/15/18 0446  NA 140 137  K 3.5 3.4*  CL 107 107  CO2 26 22  GLUCOSE 144* 111*  BUN 21* 18  CREATININE 0.57* 0.53*  CALCIUM 8.2* 8.4*   LFT No results for input(s): PROT, ALBUMIN, AST, ALT, ALKPHOS, BILITOT, BILIDIR, IBILI in the last 72 hours. PT/INR No results for input(s): LABPROT, INR in the last 72 hours.  STUDIES: Dg Chest 1 View  Result Date: 09/15/2018 CLINICAL DATA:  Aspiration pneumonia. EXAM: CHEST  1 VIEW COMPARISON:  Radiograph of September 13, 2018. FINDINGS: The heart size and mediastinal contours are within normal limits. Tracheostomy and nasogastric tube are unchanged in position. No pneumothorax or pleural effusion is noted. Both lungs are clear. The visualized skeletal structures are unremarkable. IMPRESSION: No active disease. Electronically Signed   By: Marijo Conception, M.D.   On: 09/15/2018 07:53   Dg Abd 1 View  Result Date: 09/13/2018 CLINICAL DATA:  52 y/o M; aspiration into airway. NG tube placement. EXAM: PORTABLE CHEST 1 VIEW ABDOMEN RADIOGRAPH, 1 VIEW COMPARISON:  09/13/2018 chest radiograph. 09/12/2018 abdomen radiograph. FINDINGS: Chest: Stable cardiac silhouette given projection and technique within normal limits. Tracheostomy tube projecting over trachea. Left basilar ill-defined retrocardiac opacity. No pleural effusion or pneumothorax. Enteric tube tip extends below the field of view into the abdomen. No acute osseous abnormality identified. Abdomen: Enteric tube tip projects over gastric body. Upper abdominal bowel gas pattern is normal. IMPRESSION: Enteric tube tip projects over gastric body. Ill-defined left basilar opacity may represent atelectasis, pneumonia, or aspiration. Electronically Signed   By: Kristine Garbe M.D.   On: 09/13/2018 22:06   Dg Chest Port 1 View  Result Date: 09/13/2018 CLINICAL DATA:  52 y/o M;  aspiration into airway. NG tube placement. EXAM: PORTABLE CHEST 1 VIEW ABDOMEN RADIOGRAPH, 1 VIEW COMPARISON:  09/13/2018 chest radiograph. 09/12/2018 abdomen radiograph. FINDINGS: Chest: Stable cardiac silhouette given projection and technique within normal limits. Tracheostomy tube projecting over trachea. Left basilar ill-defined retrocardiac opacity. No pleural effusion or pneumothorax. Enteric tube tip extends below the field of view into the abdomen. No acute osseous abnormality identified. Abdomen: Enteric tube tip projects over gastric body. Upper abdominal bowel gas pattern is normal. IMPRESSION: Enteric tube tip projects over gastric body. Ill-defined left basilar opacity may represent atelectasis, pneumonia, or aspiration. Electronically Signed   By: Kristine Garbe M.D.   On: 09/13/2018 22:06      Impression / Plan:   Assessment: Active Problems:   Tracheostomy, acute management ([facility])   Acute respiratory failure (HCC)   Laryngeal mass   Malnutrition of moderate degree   Alan Chung is a 52 y.o. y/o male with a new diagnosis of squamous cell carcinoma in the supraglottis area.  The patient had a CT scan of the neck  and direct visualization by ENT.  On review of those films and pictures it is clear that the path into the esophagus would be very difficult and may involve disruption of the laryngeal mass.  Plan: Since the patient's tumor is obstructing his path to the esophagus and thereby making a endoscopically placed PEG tube dangerous and difficult, I recommend the patient undergo a PEG tube placement by interventional radiology.  If they are unable to perform the procedure than a surgical G-tube or J-tube may be needed.  There is no role for gastroenterology in this patient's care at this time.  I will sign off.  Please call if any further GI concerns or questions.  We would like to thank you for the opportunity to participate in the care of Alan Chung.   Thank you  for involving me in the care of this patient.      LOS: 5 days   Lucilla Lame, MD  09/15/2018, 3:54 PM    Note: This dictation was prepared with Dragon dictation along with smaller phrase technology. Any transcriptional errors that result from this process are unintentional.

## 2018-09-15 NOTE — Evaluation (Addendum)
[facility] Speaking Valve - Evaluation Patient Details  Name: Alan Chung MRN: [id number] Date of Birth: Sep 04, 1966  Today's Date: 09/15/2018 Time: 6010-9323 SLP Time Calculation (min) (ACUTE ONLY): 60 min  Past Medical History:  Past Medical History:  Diagnosis Date  . Hypertension    Past Surgical History:  Past Surgical History:  Procedure Laterality Date  . DIAGNOSTIC LARYNGOSCOPY  09/10/2018   Procedure: DIAGNOSTIC LARYNGOSCOPY WITH BIOPSY;  Surgeon: Carloyn Manner, MD;  Location: [facility] ORS;  Service: ENT;;  . right ankle surgery    . TRACHEOSTOMY TUBE PLACEMENT N/A 09/10/2018   Procedure: TRACHEOSTOMY;  Surgeon: Carloyn Manner, MD;  Location: [facility] ORS;  Service: ENT;  Laterality: N/A;   HPI:  Pt is an 52 y.o. male. with a past medical history remarkable for hypertension, every day smoker, who began having symptoms of laryngitis for the past 1 to 2 months.  He was recently given a course of prednisone and antibiotics for possible laryngitis/bronchitis.  He presented to the emergency department today complaining of shortness of breath stating that the air is getting stuck in his throat.  On physical exam he was stridorous.  Dr. Pryor Ochoa saw him emergently in the emergency department.  He was taken for emergent tracheostomy with presumptive laryngeal cancer, SCC. Trach Shiley #8 in place, on trach collar this morning.  Per CT of Neck: Large irregular supraglottic and inferior oro pharyngeal mass, possibly arising from the region of the epiglottis. Mass measures up to 5 cm in size. More bulky on the Left but involving both sides.    Assessment / Plan / Recommendation Clinical Impression  Pt was seen today for PMV (speaking valve) assessment. Pt presents w/ a Tracheostomy, Shiley #8 w/ cuff deflated at baseline. Noted min+ secretions at level of stoma/trach; pt frequently using the yaunker at the stoma to clear drainage. Pt is receiving tracheal suctioning PRN per RT/NSG. At rest,  noted increased airflow at the level of the trach. O2 sats were 99%, HR 71, RR calm/unlabored. As trach cuff is deflated at baseline, Finger occlusion was attempted w/ minimal phonation or verbal output; quality was c/b graveliness w/ decreased volume. To allow for better coordination of speech as well, PMV placement was attempted though suspected superior airflow deficits. Upon placing the PMV on the trach, pt indicated he felt "ok" during inhalation then quickly felt he could not exhale sufficiently stating it felt "tight". PMV was removed w/ an increased rush of air at the level of the trach. After a brief rest, PMV placement was attempted again w/ same results. Pt was encouraged to try talking and phonating w/ vocal quality c/b graveliness and decreased voluem as well as overall minimal ability to produce phonations. Pt immediately indicating it felt more uncomfortable the longer the PMV was left in place (<30 secs). Upon removing, the same rush of air followed by coughing and tracheal secretions. No further PMV placement attempted. Pt does not tolerate PMV placement for phonation or verbal communication at this time. He appears restricted in his ability to exhale thus is breath stacking. Suspect impedement from the Supraglottic mass; possibly the size of the trach(shiley #8). Pt is not recommended to wear/use the PMV at this time. PMV use could be considered if size of trach can be downsized and/or size of mass can be reduced. Evaluation results were discussed w/ pt, NSG. Spoke w/ pt about the possible use of finger occlusion to get out a Single word as able; frequent oral care for oral hygiene and stimulation  of swallowing. Recommended use of communication board(provided) and writing board/paper. NSG updated. ST services will be available for further education or assessment as indicated.  SLP Visit Diagnosis: Aphonia (R49.1)(tracheostomy; supraglottic mass)    SLP Assessment  Patient does not need any  further Speech Lanaguage Pathology Services(at this time)    Follow Up Recommendations  None(at this time - pending outcome of medical options)    Frequency and Duration  n/a at this time      PMSV Trial PMSV was placed for: less than 30 seconds x2 trials Able to redirect subglottic air through upper airway: No(insufficient) Able to Attain Phonation: Yes(slight; minimal word) Voice Quality: Hoarse;Low vocal intensity(gravely) Able to Expectorate Secretions: No attempts(coughing occurred post removing the PMV) Level of Secretion Expectoration with PMSV: Tracheal Breath Support for Phonation: Severely decreased Intelligibility: Intelligibility reduced SpO2 During Trial: 99 % Pulse During Trial: 71 Behavior: Anxious;Expresses self well;Good eye contact;Responsive to questions(after removal of PMV)   Tracheostomy Tube  Additional Tracheostomy Tube Assessment Trach Collar Period: pt is on trach collar at baseline Secretion Description: min+ Frequency of Tracheal Suctioning: PRN Level of Secretion Expectoration: Tracheal(at stoma)    Vent Dependency  Vent Dependent: No    Cuff Deflation Trial  GO Tolerated Cuff Deflation: Yes Length of Time for Cuff Deflation Trial: at baseline Behavior: Alert;Cooperative;Expresses self well;Responsive to questions(in min discomfort) Cuff Deflation Trial - Comments: n/a        , 09/15/2018, 12:48 PM  Orinda Kenner, MS, CCC-SLP

## 2018-09-15 NOTE — Progress Notes (Signed)
[place] [facility] [place] NAME: Alan Chung    MR#:  [id number]  DATE OF BIRTH:  Feb 10, 1966  SUBJECTIVE:    Having copious secretions this morning. Denies any shortness of breath or chest pain. No fevers or chills.  REVIEW OF SYSTEMS:   ROS-unable to obtain due to trach  DRUG ALLERGIES:   Allergies  Allergen Reactions  . Tramadol Itching    VITALS:  Blood pressure (!) 152/76, pulse 66, temperature 98.9 F (37.2 C), temperature source Axillary, resp. rate 20, height 6\' 1"  (1.854 m), weight 90.3 kg, SpO2 97 %.  PHYSICAL EXAMINATION:  GENERAL:  52 y.o.-year-old patient lying in the bed, in NAD, +coughing intermittently EYES: Pupils equal, round, reactive to light . No scleral icterus. Extraocular muscles intact.  HEENT: Head atraumatic, normocephalic. Oropharynx and nasopharynx clear.  NECK:  Supple, no jugular venous distention. No thyroid enlargement, no tenderness. + Trach with copious clear/tan secretions. LUNGS: + Diminished breath sounds throughout, no wheezing, rales,rhonchi or crepitation. No use of accessory muscles of respiration.  CARDIOVASCULAR: RRR, S1, S2 normal. No murmurs, rubs, or gallops.  ABDOMEN: Soft, nontender, nondistended. Bowel sounds present. No organomegaly or mass.  EXTREMITIES: No pedal edema, cyanosis, or clubbing.  NEUROLOGIC: Alert, follows commands, answers questions with yes or no.  PSYCHIATRIC: Difficult to assess  SKIN: No obvious rash, lesion, or ulcer.   LABORATORY PANEL:   CBC Recent Labs  Lab 09/15/18 0446  WBC 9.5  HGB 13.9  HCT 41.9  PLT 172   ------------------------------------------------------------------------------------------------------------------  Chemistries  Recent Labs  Lab 09/11/18 0426  09/14/18 0526 09/15/18 0446  NA 138   < > 140 137  K 3.8   < > 3.5 3.4*  CL 101   < > 107 107  CO2 29   < > 26 22  GLUCOSE 163*   < > 144* 111*  BUN 11   < > 21* 18   CREATININE 0.66   < > 0.57* 0.53*  CALCIUM 8.7*   < > 8.2* 8.4*  MG 1.9   < > 2.1  --   AST 16  --   --   --   ALT 15  --   --   --   ALKPHOS 47  --   --   --   BILITOT 0.3  --   --   --    < > = values in this interval not displayed.   ------------------------------------------------------------------------------------------------------------------  Cardiac Enzymes Recent Labs  Lab 09/10/18 1046  TROPONINI <0.03   ------------------------------------------------------------------------------------------------------------------  RADIOLOGY:  Dg Chest 1 View  Result Date: 09/15/2018 CLINICAL DATA:  Aspiration pneumonia. EXAM: CHEST  1 VIEW COMPARISON:  Radiograph of September 13, 2018. FINDINGS: The heart size and mediastinal contours are within normal limits. Tracheostomy and nasogastric tube are unchanged in position. No pneumothorax or pleural effusion is noted. Both lungs are clear. The visualized skeletal structures are unremarkable. IMPRESSION: No active disease. Electronically Signed   By: Marijo Conception, M.D.   On: 09/15/2018 07:53   Dg Abd 1 View  Result Date: 09/13/2018 CLINICAL DATA:  52 y/o M; aspiration into airway. NG tube placement. EXAM: PORTABLE CHEST 1 VIEW ABDOMEN RADIOGRAPH, 1 VIEW COMPARISON:  09/13/2018 chest radiograph. 09/12/2018 abdomen radiograph. FINDINGS: Chest: Stable cardiac silhouette given projection and technique within normal limits. Tracheostomy tube projecting over trachea. Left basilar ill-defined retrocardiac opacity. No pleural effusion or pneumothorax. Enteric tube tip extends below the field of view  into the abdomen. No acute osseous abnormality identified. Abdomen: Enteric tube tip projects over gastric body. Upper abdominal bowel gas pattern is normal. IMPRESSION: Enteric tube tip projects over gastric body. Ill-defined left basilar opacity may represent atelectasis, pneumonia, or aspiration. Electronically Signed   By: Kristine Garbe  M.D.   On: 09/13/2018 22:06   Dg Chest Port 1 View  Result Date: 09/13/2018 CLINICAL DATA:  52 y/o M; aspiration into airway. NG tube placement. EXAM: PORTABLE CHEST 1 VIEW ABDOMEN RADIOGRAPH, 1 VIEW COMPARISON:  09/13/2018 chest radiograph. 09/12/2018 abdomen radiograph. FINDINGS: Chest: Stable cardiac silhouette given projection and technique within normal limits. Tracheostomy tube projecting over trachea. Left basilar ill-defined retrocardiac opacity. No pleural effusion or pneumothorax. Enteric tube tip extends below the field of view into the abdomen. No acute osseous abnormality identified. Abdomen: Enteric tube tip projects over gastric body. Upper abdominal bowel gas pattern is normal. IMPRESSION: Enteric tube tip projects over gastric body. Ill-defined left basilar opacity may represent atelectasis, pneumonia, or aspiration. Electronically Signed   By: Kristine Garbe M.D.   On: 09/13/2018 22:06    EKG:  No orders found for this or any previous visit.  ASSESSMENT AND PLAN:  Acute upper airway obstruction s/p emergency tracheostomy by ENT due to squamous cell carcinoma of the supraglottis.  CT head/chest/abdomen/pelvis negative for mets. -Continue trach collar -ENT and oncology following  -Patient will need PEG- GI unable to perform due to tumor, so will consult IR -Continue duonebs as needed -SLP eval for PMV -Trach change after feeding tube placement -Plan for likely discharge home and follow-up at [facility]  Aspiration pneumonia-chest x-ray with left basilar opacity. Repeat CXR negative. -Continue Unasyn -Duonebs as needed  Nausea/vomiting-abdominal exam benign. AXR with gas pattern. Resolved this morning. -Continue tube feeds  LE edema-stable -Lower extremity Dopplers negative for DVT -Monitor  Hyperglycemia-blood sugars mildly elevated, no diagnosis of diabetes. A1c 5.5% this admission -Continue sliding scale insulin  All the records are reviewed and case discussed  with Care Management/Social Workerr. Management plans discussed with the patient, family and they are in agreement.  CODE STATUS: Full code  TOTAL TIME TAKING CARE OF THIS PATIENT: 35 minutes.   POSSIBLE D/C IN 1-2 DAYS, DEPENDING ON CLINICAL CONDITION.   Berna Spare Tiawanna Luchsinger M.D on 09/15/2018 at 12:07 PM  Between 7am to 6pm - Pager - [phone]  After 6pm go to [email or web address] - password [email or web address]  [facility]  Office  [phone]  CC: Primary care physician; System, Provider Not In   Note: This dictation was prepared with Dragon dictation along with smaller phrase technology. Any transcriptional errors that result from this process are unintentional.

## 2018-09-15 NOTE — Progress Notes (Signed)
.. 09/15/2018 9:05 AM  Shelly Flatten [phone]  Post-Op Day 5    Temp:  [97.7 F (36.5 C)-98.9 F (37.2 C)] 98.9 F (37.2 C) (12/11 0457) Pulse Rate:  [65-76] 66 (12/11 0457) Resp:  [20-35] 20 (12/11 0457) BP: (147-210)/(59-101) 152/76 (12/11 0530) SpO2:  [93 %-100 %] 97 % (12/11 0457) FiO2 (%):  [28 %] 28 % (12/10 2030) Weight:  [90.3 kg-90.8 kg] 90.3 kg (12/11 0629),     Intake/Output Summary (Last 24 hours) at 09/15/2018 0905 Last data filed at 09/15/2018 0802 Gross per 24 hour  Intake 1479.33 ml  Output 2800 ml  Net -1320.67 ml    Results for orders placed or performed during the hospital encounter of 09/10/18 (from the past 24 hour(s))  Glucose, capillary     Status: Abnormal   Collection Time: 09/14/18 11:17 AM  Result Value Ref Range   Glucose-Capillary 121 (H) 70 - 99 mg/dL  Glucose, capillary     Status: Abnormal   Collection Time: 09/14/18  3:39 PM  Result Value Ref Range   Glucose-Capillary 110 (H) 70 - 99 mg/dL  Glucose, capillary     Status: None   Collection Time: 09/14/18  9:00 PM  Result Value Ref Range   Glucose-Capillary 89 70 - 99 mg/dL  Glucose, capillary     Status: Abnormal   Collection Time: 09/15/18 12:28 AM  Result Value Ref Range   Glucose-Capillary 110 (H) 70 - 99 mg/dL  CBC     Status: None   Collection Time: 09/15/18  4:46 AM  Result Value Ref Range   WBC 9.5 4.0 - 10.5 K/uL   RBC 4.94 4.22 - 5.81 MIL/uL   Hemoglobin 13.9 13.0 - 17.0 g/dL   HCT 41.9 39.0 - 52.0 %   MCV 84.8 80.0 - 100.0 fL   MCH 28.1 26.0 - 34.0 pg   MCHC 33.2 30.0 - 36.0 g/dL   RDW 13.2 11.5 - 15.5 %   Platelets 172 150 - 400 K/uL   nRBC 0.0 0.0 - 0.2 %  Basic metabolic panel     Status: Abnormal   Collection Time: 09/15/18  4:46 AM  Result Value Ref Range   Sodium 137 135 - 145 mmol/L   Potassium 3.4 (L) 3.5 - 5.1 mmol/L   Chloride 107 98 - 111 mmol/L   CO2 22 22 - 32 mmol/L   Glucose, Bld 111 (H) 70 - 99 mg/dL   BUN 18 6 - 20 mg/dL   Creatinine,  Ser 0.53 (L) 0.61 - 1.24 mg/dL   Calcium 8.4 (L) 8.9 - 10.3 mg/dL   GFR calc non Af Amer >60 >60 mL/min   GFR calc Af Amer >60 >60 mL/min   Anion gap 8 5 - 15  Glucose, capillary     Status: Abnormal   Collection Time: 09/15/18  5:09 AM  Result Value Ref Range   Glucose-Capillary 105 (H) 70 - 99 mg/dL  Glucose, capillary     Status: None   Collection Time: 09/15/18  7:40 AM  Result Value Ref Range   Glucose-Capillary 98 70 - 99 mg/dL    SUBJECTIVE:  No acute events.  Transferred to floor.  Ambulating to BR.  Breathing well.  OBJECTIVE:  GEN-  NAD, sitting in bed NECK-  Trach secure and patent, cuff deflated.  Patient able to phonate with occlusion  IMPRESSION:  S/p tracheostomy for airway compromise due to T4 SCCA of supraglottis  PLAN:  1)  Speech evaluation for PMV  2)  Still waiting on word from [facility] regarding timing of recommended surgery/evaluation there but again most likely will be discharged home prior  3)  Patient will need feeding tube and may benefit from Interventional Radiology placement due to obstructing tumor  4)  Will hold on trach change until after feeding tube placement.  5)  Trach care teaching for patient and family as well as trach supplies for home upon discharge.  He will have a size 8 uncuffed Shiley trach and need suction and inner cannulas and trach care kits.  Miking Usrey 09/15/2018, 9:05 AM

## 2018-09-16 ENCOUNTER — Encounter: Payer: Self-pay | Admitting: Interventional Radiology

## 2018-09-16 ENCOUNTER — Inpatient Hospital Stay: Payer: Medicaid Other

## 2018-09-16 ENCOUNTER — Inpatient Hospital Stay: Payer: Self-pay

## 2018-09-16 ENCOUNTER — Other Ambulatory Visit: Payer: Self-pay | Admitting: *Deleted

## 2018-09-16 DIAGNOSIS — C329 Malignant neoplasm of larynx, unspecified: Secondary | ICD-10-CM

## 2018-09-16 HISTORY — PX: IR GASTROSTOMY TUBE MOD SED: IMG625

## 2018-09-16 LAB — CBC
HCT: 41.3 % (ref 39.0–52.0)
Hemoglobin: 13.6 g/dL (ref 13.0–17.0)
MCH: 28 pg (ref 26.0–34.0)
MCHC: 32.9 g/dL (ref 30.0–36.0)
MCV: 85 fL (ref 80.0–100.0)
NRBC: 0 % (ref 0.0–0.2)
PLATELETS: 214 10*3/uL (ref 150–400)
RBC: 4.86 MIL/uL (ref 4.22–5.81)
RDW: 13.1 % (ref 11.5–15.5)
WBC: 10.8 10*3/uL — ABNORMAL HIGH (ref 4.0–10.5)

## 2018-09-16 LAB — BASIC METABOLIC PANEL
Anion gap: 6 (ref 5–15)
BUN: 18 mg/dL (ref 6–20)
CO2: 23 mmol/L (ref 22–32)
Calcium: 8.1 mg/dL — ABNORMAL LOW (ref 8.9–10.3)
Chloride: 106 mmol/L (ref 98–111)
Creatinine, Ser: 0.55 mg/dL — ABNORMAL LOW (ref 0.61–1.24)
GFR calc Af Amer: >60 mL/min (ref >60)
Glucose, Bld: 90 mg/dL (ref 70–99)
Potassium: 3.6 mmol/L (ref 3.5–5.1)
Sodium: 135 mmol/L (ref 135–145)

## 2018-09-16 LAB — GLUCOSE, CAPILLARY
Glucose-Capillary: 101 mg/dL — ABNORMAL HIGH (ref 70–99)
Glucose-Capillary: 102 mg/dL — ABNORMAL HIGH (ref 70–99)
Glucose-Capillary: 71 mg/dL (ref 70–99)
Glucose-Capillary: 72 mg/dL (ref 70–99)
Glucose-Capillary: 86 mg/dL (ref 70–99)
Glucose-Capillary: 86 mg/dL (ref 70–99)

## 2018-09-16 LAB — TRIGLYCERIDES: Triglycerides: 90 mg/dL (ref <150)

## 2018-09-16 MED ORDER — FREE WATER
150.0000 mL | Freq: Four times a day (QID) | Status: DC
  Administered 2018-09-17 – 2018-09-20 (×10): 150 mL

## 2018-09-16 MED ORDER — FENTANYL CITRATE (PF) 100 MCG/2ML IJ SOLN
INTRAMUSCULAR | Status: AC
  Filled 2018-09-16: qty 2

## 2018-09-16 MED ORDER — OSMOLITE 1.5 CAL PO LIQD
120.0000 mL | Freq: Four times a day (QID) | ORAL | Status: DC
  Administered 2018-09-17: 120 mL

## 2018-09-16 MED ORDER — MIDAZOLAM HCL 2 MG/2ML IJ SOLN
INTRAMUSCULAR | Status: AC
  Filled 2018-09-16: qty 4

## 2018-09-16 MED ORDER — MIDAZOLAM HCL 2 MG/2ML IJ SOLN
INTRAMUSCULAR | Status: AC | PRN
  Administered 2018-09-16 (×3): 1 mg via INTRAVENOUS

## 2018-09-16 MED ORDER — FENTANYL CITRATE (PF) 100 MCG/2ML IJ SOLN
INTRAMUSCULAR | Status: AC | PRN
  Administered 2018-09-16 (×3): 50 ug via INTRAVENOUS

## 2018-09-16 MED ORDER — PRO-STAT SUGAR FREE PO LIQD
30.0000 mL | Freq: Two times a day (BID) | ORAL | Status: DC
  Administered 2018-09-17 – 2018-09-20 (×6): 30 mL

## 2018-09-16 MED ORDER — GLUCAGON HCL RDNA (DIAGNOSTIC) 1 MG IJ SOLR
INTRAMUSCULAR | Status: AC
  Filled 2018-09-16: qty 1

## 2018-09-16 MED ORDER — HYDROMORPHONE HCL 1 MG/ML IJ SOLN
INTRAMUSCULAR | Status: AC
  Administered 2018-09-16: 15:00:00 2 mg via INTRAVENOUS
  Filled 2018-09-16: qty 2

## 2018-09-16 MED ORDER — GLUCAGON HCL (RDNA) 1 MG IJ SOLR
INTRAMUSCULAR | Status: AC | PRN
  Administered 2018-09-16: 1 mg via INTRAVENOUS

## 2018-09-16 MED ORDER — IOPAMIDOL (ISOVUE-300) INJECTION 61%
50.0000 mL | Freq: Once | INTRAVENOUS | Status: AC | PRN
  Administered 2018-09-16: 15:00:00 10 mL via ORAL

## 2018-09-16 MED ORDER — HYDROMORPHONE HCL 1 MG/ML IJ SOLN
2.0000 mg | Freq: Once | INTRAMUSCULAR | Status: AC
  Administered 2018-09-16: 11:00:00 2 mg via INTRAVENOUS
  Filled 2018-09-16: qty 2

## 2018-09-16 MED ORDER — LIDOCAINE HCL (PF) 1 % IJ SOLN
INTRAMUSCULAR | Status: AC
  Filled 2018-09-16: qty 30

## 2018-09-16 MED ORDER — DEXTROSE-NACL 5-0.9 % IV SOLN
INTRAVENOUS | Status: DC
  Administered 2018-09-16 – 2018-09-17 (×5): via INTRAVENOUS

## 2018-09-16 MED ORDER — PHENOL 1.4 % MT LIQD
1.0000 | OROMUCOSAL | Status: DC | PRN
  Administered 2018-09-18: 1 via OROMUCOSAL
  Filled 2018-09-16 (×2): qty 177

## 2018-09-16 NOTE — Progress Notes (Signed)
Pt trach and inner cannula cleaned. Request supply to deliver another trach to have at bedside. Pt refused flexible cathater suctioning. Pt sounds as if congested and Pt is coughing productively.

## 2018-09-16 NOTE — Progress Notes (Signed)
Tumor Board Documentation  Alan Chung was presented by Dr Pryor Ochoa at our Tumor Board on 09/16/2018, which included representatives from medical oncology, radiation oncology, surgical oncology, internal medicine, navigation, pathology, radiology, surgical, pulmonology, research.  Alan Chung currently presents as a new patient with history of the following treatments: surgical intervention(s).  Additionally, we reviewed previous medical and familial history, history of present illness, and recent lab results along with all available histopathologic and imaging studies. The tumor board considered available treatment options and made the following recommendations: Active surveillance, Surgery, Concurrent chemo-radiation therapy PET Scan, Place G Tube    The following procedures/referrals were also placed: No orders of the defined types were placed in this encounter.   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  Tumor board is a meeting of clinicians from various specialty areas who evaluate and discuss patients for whom a multidisciplinary approach is being considered. Final determinations in the plan of care are those of the provider(s). The responsibility for follow up of recommendations given during tumor board is that of the provider.   Today's extended care, comprehensive team conference, [name] was not present for the discussion and was not examined.   Multidisciplinary Tumor Board is a multidisciplinary case peer review process.  Decisions discussed in the Multidisciplinary Tumor Board reflect the opinions of the specialists present at the conference without having examined the patient.  Ultimately, treatment and diagnostic decisions rest with the primary provider(s) and the patient.

## 2018-09-16 NOTE — Progress Notes (Signed)
Nutrition Follow-up  DOCUMENTATION CODES:   Non-severe (moderate) malnutrition in context of chronic illness, Obesity unspecified  INTERVENTION:  Per chart okay to use tube for tube feeds tomorrow AM.  Recommend slow advancement of bolus tube feeds in setting of risk for refeeding syndrome and to monitor tolerance: 12/13: Initiate Osmolite 1.5 1/2 carton (~120 mL) bolus QID via G-tube + Pro-Stat 30 mL BID. Provide free water flush of 60 mL before and 90 mL after each bolus tube feeding.. 12/14: Advance to Osmolite 1.5 Cal 1 carton (237 mL) bolus QID via G-tube + Pro-Stat 30 mL BID. Provide free water flush of 60 mL before and 90 mL after each bolus tube feeding. 12/15: Advance to goal regimen of Osmolite 1.5 Cal 1 carton (237 mL) bolus 6 times daily via G-tube + Pro-Stat 30 mL BID. Provide free water flush of 60 mL before and 90 mL after each bolus tube feeding.  When providing Pro-Stat, first flush tube feeding with 60 mL water. Pour 30 mL of Pro-Stat in a 4-6 fl oz container. Add 60 mL of water and mix well. Administer via syringe, Flush with 60 mL of water.  Goal regimen provides 2330 kcal, 119 grams protein, 2346 mL H2O daily (1086 mL from TFs and 1260 mL from free water flush and Pro-Stat administration).  Once patient is tolerating one full can at a time he can reduce number of feeds slowly. Can start with 1.5 can (355 mL) QID and then progress to 2 cans (474 mL) TID as tolerated. Will need to adjust free water flush regimen as number of feeds decreases to ensure patient is still receiving adequate fluid.  Recommend decreasing IV fluids with initiation of tube feeds. Once patient is on his goal TF regimen should be able to discontinue IV fluids as regimen should be meeting fluid needs.  Monitor magnesium, potassium, and phosphorus daily for at least 3 days, MD to replete as needed, as pt is at risk for refeeding syndrome given moderate malnutrition.  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  Ongoing - addressing with initiation of tube feeds tomorrow AM.  GOAL:   Patient will meet greater than or equal to 90% of their needs  Progressing with initiation of tube feeds tomorrow AM.  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   -Per Oncology note patient will need treatment with surgery at a tertiary cancer center if he is positive for SCC. 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  Patient remains on trach collar. PMSV trial was attempted yesterday but use is not recommended at this time by SLP. Patient was assessed by GI yesterday for PEG tube placement, but it was felt laryngeal mass is obstructing path to the esophagus making PEG tube placement difficult and dangerous. IR was consulted for G-tube placement and patient is now s/p successful placement of an 18 Fr. Balloon retention gastrostomy tube today. According to orders placed by Radiologist today  G-tube can be used for medications tonight at 1900 and can be used to feeding tomorrow AM at 0700.  Access: 18 Fr. G-tube placed today in IR  Medications reviewed and include: Colace 50 mg daily per tube, Protonix 40 mg daily IV, Miralax daily, Senokot, Unasyn, D5-NS at 125 mL/hr.  Labs reviewed: CBG 72-101, Creatinine 0.55.  Weight trend: 91.8 kg on 12/12; -4.4 kg from  admission  I/O: 1125 mL UOP yesterday (0.5 mL/kg/hr)  Diet Order:   Diet Order            Diet clear liquid Room service appropriate? Yes; Fluid consistency: Thin  Diet effective now        Diet NPO time specified Except for: Ice Chips  Diet effective now             EDUCATION NEEDS:   No education needs have been identified at this time  Skin:  Skin Assessment: Skin Integrity Issues: Skin Integrity Issues:: Incisions Incisions: closed incision to neck  Last BM:  Unknown/PTA  Height:   Ht Readings from Last 1 Encounters:  09/14/18 6\' 1"  (1.854 m)   Weight:   Wt Readings from Last 1 Encounters:  09/16/18 91.8 kg   Ideal Body Weight:  70 kg  BMI:  Body mass index is 26.7 kg/m.  Estimated Nutritional Needs:   Kcal:  2150-2500 (MSJ 1790 x 1.2-1.4)  Protein:  105-125 grams (1.1-1.3 grams/kg actual body wt; 1.5-1.8 grams/kg IBW)  Fluid:  2.1-2.4 L/day (30-35 mL/kg IBW)  Willey Blade, MS, RD, LDN Office: [phone] Pager: [phone] After Hours/Weekend Pager: [phone]

## 2018-09-16 NOTE — Progress Notes (Signed)
Jonavin Seder   DOB:01/14/1966   IB#:[id number]    Subjective: Patient resting in the bed.  No acute issues overnight.  Patient felt not to be a candidate for PEG tube through GI.  Awaiting PEG tube through IR.  Has NG tube in place.  Objective:  Vitals:   09/15/18 2029 09/16/18 0426  BP:  (!) 166/85  Pulse:  (!) 57  Resp:    Temp:  98.5 F (36.9 C)  SpO2: 99% 93%     Intake/Output Summary (Last 24 hours) at 09/16/2018 1359 Last data filed at 09/16/2018 0846 Gross per 24 hour  Intake 1106.06 ml  Output 1225 ml  Net -118.94 ml    Physical Exam  Constitutional: He is oriented to person, place, and time and well-developed, well-nourished, and in no distress.  Resting in the bed no acute distress.  HENT:  Head: Normocephalic and atraumatic.  Mouth/Throat: Oropharynx is clear and moist. No oropharyngeal exudate.  Eyes: Pupils are equal, round, and reactive to light.  Neck: Normal range of motion. Neck supple.  tracheostomy in place.  Cardiovascular: Normal rate and regular rhythm.  Pulmonary/Chest: No respiratory distress. He has no wheezes.  Abdominal: Soft. Bowel sounds are normal. He exhibits no distension and no mass. There is no abdominal tenderness. There is no rebound and no guarding.  Musculoskeletal: Normal range of motion.        General: No tenderness or edema.  Neurological: He is alert and oriented to person, place, and time.  Skin: Skin is warm.  Psychiatric: Affect normal.     Labs:  Lab Results  Component Value Date   WBC 10.8 (H) 09/16/2018   HGB 13.6 09/16/2018   HCT 41.3 09/16/2018   MCV 85.0 09/16/2018   PLT 214 09/16/2018   NEUTROABS 6.9 09/10/2018    Lab Results  Component Value Date   NA 135 09/16/2018   K 3.6 09/16/2018   CL 106 09/16/2018   CO2 23 09/16/2018    Studies:  Dg Chest 1 View  Result Date: 09/15/2018 CLINICAL DATA:  Aspiration pneumonia. EXAM: CHEST  1 VIEW COMPARISON:  Radiograph of September 13, 2018. FINDINGS: The heart  size and mediastinal contours are within normal limits. Tracheostomy and nasogastric tube are unchanged in position. No pneumothorax or pleural effusion is noted. Both lungs are clear. The visualized skeletal structures are unremarkable. IMPRESSION: No active disease. Electronically Signed   By: Marijo Conception, M.D.   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  Pathology positive for squamous cell carcinoma.    #Acute respiratory failure secondary to #1 obstruction secondary to tumor-status post tracheostomy; management as per ENT  #Dietary -awaiting PEG tube placement through IR today.  Recommendations: Patient need PET scan as outpatient.  Await [facility] recommendations regarding primary/definitive surgery.  If patient given the extent of bulky disease not a candidate for upfront surgery-induction chemotherapy would be recommended.  Discussed with Dr. Pryor Ochoa.  Imaging/pathology reviewed at the tumor conference today.  Also discussed with Dr. Brett Albino.  Cammie Sickle, MD 09/16/2018  1:59 PM

## 2018-09-16 NOTE — Consult Note (Signed)
Chief Complaint: Patient was seen in consultation today for  Chief Complaint  Patient presents with  . Shortness of Breath   at the request of * No referring provider recorded for this case *  Referring Physician(s): * No referring provider recorded for this case *  Supervising Physician: Marybelle Killings  Patient Status: [place] - In-pt  History of Present Illness: Alan Chung is a 52 y.o. 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  He subsequently underwent a tracheostomy.  Currently, an NG tube is in place for tube feeds.  Gastrostomy tube is requested.  Past Medical History:  Diagnosis Date  . Hypertension     Past Surgical History:  Procedure Laterality Date  . DIAGNOSTIC LARYNGOSCOPY  09/10/2018   Procedure: DIAGNOSTIC LARYNGOSCOPY WITH BIOPSY;  Surgeon: Carloyn Manner, MD;  Location: [facility] ORS;  Service: ENT;;  . right ankle surgery    . TRACHEOSTOMY TUBE PLACEMENT N/A 09/10/2018   Procedure: TRACHEOSTOMY;  Surgeon: Carloyn Manner, MD;  Location: [facility] ORS;  Service: ENT;  Laterality: N/A;    Allergies: Tramadol  Medications: Prior to Admission medications   Medication Sig Start Date End Date Taking? Authorizing Provider  albuterol (PROVENTIL HFA;VENTOLIN HFA) 108 (90 Base) MCG/ACT inhaler Inhale 2 puffs into the lungs every 6 (six) hours as needed for wheezing or shortness of breath. 08/24/18   Lannie Fields, PA-C  ibuprofen (ADVIL,MOTRIN) 800 MG tablet Take 800 mg by mouth every 8 (eight) hours as needed.    [provider]  predniSONE (DELTASONE) 50 MG tablet Take one 50 mg tablet once daily for the next five days. 08/24/18   Lannie Fields, PA-C     History reviewed. No pertinent family history.  Social History   Socioeconomic History  . Marital status: Single    Spouse name: Not on file  . Number of children: Not on file  . Years of education: Not on file  . Highest education level: Not  on file  Occupational History  . Not on file  Social Needs  . Financial resource strain: Not on file  . Food insecurity:    Worry: Not on file    Inability: Not on file  . Transportation needs:    Medical: Not on file    Non-medical: Not on file  Tobacco Use  . Smoking status: Current Every Day Smoker  . Smokeless tobacco: Never Used  Substance and Sexual Activity  . Alcohol use: No  . Drug use: No  . Sexual activity: Not on file  Lifestyle  . Physical activity:    Days per week: Not on file    Minutes per session: Not on file  . Stress: Not on file  Relationships  . Social connections:    Talks on phone: Not on file    Gets together: Not on file    Attends religious service: Not on file    Active member of club or organization: Not on file    Attends meetings of clubs or organizations: Not on file    Relationship status: Not on file  Other Topics Concern  . Not on file  Social History Narrative   Patient lives with his brother in [place].  He works as a Animator.  Previously married; has children.  Long-standing history of smoking since teenage years.  No significant alcohol use.      #1 brother had brain tumor-died.     Review of Systems: A  12 point ROS discussed and pertinent positives are indicated in the HPI above.  All other systems are negative.  Review of Systems  Vital Signs: BP (!) 166/85 (BP Location: Left Arm)   Pulse (!) 57   Temp 98.5 F (36.9 C) (Oral)   Resp 18   Ht 6\' 1"  (1.854 m)   Wt 91.8 kg   SpO2 93%   BMI 26.70 kg/m   Physical Exam Constitutional:      Appearance: He is well-developed.  HENT:     Head: Normocephalic and atraumatic.  Pulmonary:     Effort: Pulmonary effort is normal.     Comments: Positive rhonchi Abdominal:     Palpations: Abdomen is soft.  Skin:    General: Skin is warm and dry.  Neurological:     General: No focal deficit present.     Mental Status: He is alert and oriented to person, place, and time.      Imaging: Dg Chest 1 View  Result Date: 09/15/2018 CLINICAL DATA:  Aspiration pneumonia. EXAM: CHEST  1 VIEW COMPARISON:  Radiograph of September 13, 2018. FINDINGS: The heart size and mediastinal contours are within normal limits. Tracheostomy and nasogastric tube are unchanged in position. No pneumothorax or pleural effusion is noted. Both lungs are clear. The visualized skeletal structures are unremarkable. IMPRESSION: No active disease. Electronically Signed   By: Marijo Conception, M.D.   On: 09/15/2018 07:53   Dg Chest 2 View  Result Date: 09/10/2018 CLINICAL DATA:  Patient with shortness of breath EXAM: CHEST - 2 VIEW COMPARISON:  Chest radiograph 08/23/2018 FINDINGS: The heart size and mediastinal contours are within normal limits. Both lungs are clear. The visualized skeletal structures are unremarkable. IMPRESSION: No active cardiopulmonary disease. Electronically Signed   By: Lovey Newcomer M.D.   On: 09/10/2018 10:48   Dg Chest 2 View  Result Date: 08/23/2018 CLINICAL DATA:  Sore throat for 2 months. Laryngitis. Nonproductive cough. Smoker. EXAM: CHEST - 2 VIEW COMPARISON:  None. FINDINGS: Normal heart size and pulmonary vascularity. No airspace disease or consolidation in the lungs. Blunting of the left costophrenic angle likely representing a small pleural effusion. No pneumothorax. Mediastinal contours appear intact. IMPRESSION: Small left pleural effusion. No evidence of active pulmonary disease. Electronically Signed   By: Lucienne Capers M.D.   On: 08/23/2018 23:46   Dg Abd 1 View  Result Date: 09/13/2018 CLINICAL DATA:  52 y/o M; aspiration into airway. NG tube placement. EXAM: PORTABLE CHEST 1 VIEW ABDOMEN RADIOGRAPH, 1 VIEW COMPARISON:  09/13/2018 chest radiograph. 09/12/2018 abdomen radiograph. FINDINGS: Chest: Stable cardiac silhouette given projection and technique within normal limits. Tracheostomy tube projecting over trachea. Left basilar ill-defined retrocardiac opacity.  No pleural effusion or pneumothorax. Enteric tube tip extends below the field of view into the abdomen. No acute osseous abnormality identified. Abdomen: Enteric tube tip projects over gastric body. Upper abdominal bowel gas pattern is normal. IMPRESSION: Enteric tube tip projects over gastric body. Ill-defined left basilar opacity may represent atelectasis, pneumonia, or aspiration. Electronically Signed   By: Kristine Garbe M.D.   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 EXAM: CT HEAD WITHOUT CONTRAST TECHNIQUE: Contiguous axial images were obtained from the base of the skull through the vertex without intravenous contrast. COMPARISON:  Prior neck CT from earlier the same day. FINDINGS: Brain: Cerebral volume within normal limits for age. No acute intracranial hemorrhage. No  acute large vessel territory infarct. No mass lesion, midline shift or mass effect. No hydrocephalus. No extra-axial fluid collection. Vascular: No hyperdense vessel. Skull: Scalp soft tissues demonstrate no acute finding. 4.1 cm lipoma noted at the left occipital scalp. Calvarium intact. No focal osseous lesions. Sinuses/Orbits: Globes and orbital soft tissues within normal limits. Mild scattered mucosal thickening within the ethmoidal air cells. Paranasal sinuses are otherwise clear. No mastoid effusion. Other: None. IMPRESSION: 1. No acute intracranial abnormality. No appreciable intracranial mass to suggest metastatic disease. 2. 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 Electronically Signed   By: Jeannine Boga M.D.   On: 09/10/2018 23:05   Ct Soft Tissue Neck W Contrast  Addendum Date: 09/11/2018   ADDENDUM REPORT: 09/11/2018 07:32 ADDENDUM: Repeat scanning was performed at 2216 hours. The patient has undergone tracheostomy which appears satisfactory. There is expected air/gas in the soft tissues of the upper  mediastinum and neck following this procedure. There is now complete obstruction at the level the hypopharynx and supraglottic airway. There is a large mass of the inferior oropharynx and supraglottic region, possibly arising from the epiglottis. Mass measures up to 5 cm in size. The lesion is bilateral but more bulky on the left. Question erosion of the upper portion of the thyroid cartilage on the left. There is enlarged level 3 node on the right axial image 61 consistent with metastatic involvement. Enlarged level 5 nodes on the left may be involved by metastatic disease, largest node short axis dimension 11 mm image 67. IMPRESSION: IMPRESSION Repeat study performed after tracheostomy. Tracheostomy appears satisfactory. Air/gas in the soft tissue planes of the upper mediastinum and neck as expected. Large irregular supraglottic and inferior oro pharyngeal mass, possibly arising from the region of the epiglottis. Mass measures up to 5 cm in size. More bulky on the left, but involving both sides. Questionable left superior thyroid cartilage erosion. Likely malignant adenopathy, level 3 on the right and level 5 on the left. Electronically Signed   By: Nelson Chimes M.D.   On: 09/11/2018 07:32   Result Date: 09/11/2018 CLINICAL DATA:  52 year old male with laryngitis symptoms for the past 1-2 months and 20 lb unintentional weight loss. EXAM: CT NECK WITH CONTRAST TECHNIQUE: Multidetector CT imaging of the neck was performed using the standard protocol following the bolus administration of intravenous contrast. CONTRAST:  93mL OMNIPAQUE IOHEXOL 300 MG/ML  SOLN COMPARISON:  Face CT 04/10/2014. FINDINGS: This exam is moderately to severely motion degraded throughout. I asked for the study to be repeated with a smaller additional contrast bolus, but the emergency department has declined this for now, the patient is being seen by ENT and possibly being intubated. Pharynx and larynx: The larynx detail is highly degraded  by motion. There is abnormal bilateral laryngeal soft tissue thickening ranging from 15-25 millimeters and most pronounced at the anterior, STIR as seen on series 2, image 67. This appears to be at the level of the false cords and AE folds, and is fairly symmetric. The epiglottis is not evaluated. The true cords on series 2, image 81 have a more normal appearance. There may be abnormal soft tissue on both sides of the thyroid cartilage, uncertain. The hypopharynx is distended with gas. The oropharynx and nasopharynx contours are within normal limits. Negative parapharyngeal and retropharyngeal spaces. Salivary glands: Limited due to motion. No sublingual space, submandibular or parotid abnormality identified. Thyroid: Limited due to motion, no abnormality identified. Lymph nodes: Limited due to motion. There is an enlarged  right level IIIa lymph nodes suspected on series 2, image 68 measuring 12 millimeters short axis. Contralateral left level 3 lymphadenopathy is possible. Bilateral level 2 lymph nodes appear more normal. No cystic or necrotic node is evident. Vascular: Detail severely degraded by motion. Grossly patent major vascular structures in the neck. Limited intracranial: Limited by motion, negative visible brain parenchyma. Visualized orbits: Negative. Mastoids and visualized paranasal sinuses: Well pneumatized aside from mild to moderate left maxillary sinus mucosal thickening. Skeleton: Limited due to motion. Multilevel degenerative changes in the cervical spine. No acute or suspicious osseous lesion identified. Upper chest: The subglottic trachea appears within normal limits. No superior mediastinal lymphadenopathy. Negative visible lung parenchyma. Other findings: Benign left occipital scalp lipoma, 39 millimeters diameter by 14 millimeters in thickness. IMPRESSION: 1. Limited by motion artifact throughout, severely so at the hypopharynx and supraglottic larynx. Patient unavailable for repeat imaging  at this time per the ED. A repeat Neck CT (IV contrast preferred) is suggested when possible for improved imaging accuracy. 2. 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 Possible involvement of the bilateral thyroid cartilage. 3. Hypopharynx distended with gas raising the possibility of airway stenosis. 4. Suspected malignant right level IIIa lymph node, 12 mm short axis on series 2, image 68. Electronically Signed: By: Genevie Ann M.D. On: 09/10/2018 13:37   Ct Angio Chest Pe W Or Wo Contrast  Result Date: 09/10/2018 CLINICAL DATA:  52 year old male with concern for laryngeal cancer presents with shortness of breath and stridor. EXAM: CT ANGIOGRAPHY CHEST CT ABDOMEN AND PELVIS WITH CONTRAST TECHNIQUE: Multidetector CT imaging of the chest was performed using the standard protocol during bolus administration of intravenous contrast. Multiplanar CT image reconstructions and MIPs were obtained to evaluate the vascular anatomy. Multidetector CT imaging of the abdomen and pelvis was performed using the standard protocol during bolus administration of intravenous contrast. CONTRAST:  68mL OMNIPAQUE IOHEXOL 350 MG/ML SOLN COMPARISON:  Chest radiograph dated 09/10/2018 and neck CT dated 09/10/2018 FINDINGS: Evaluation is limited due to streak artifact caused by patient's arms. CTA CHEST FINDINGS Cardiovascular: There is no cardiomegaly or pericardial effusion. The thoracic aorta is unremarkable. There is no CT evidence of pulmonary embolism. Mediastinum/Nodes: No hilar or mediastinal adenopathy. The esophagus is grossly unremarkable. There is air in the upper mediastinum and lower neck which is new compared to the prior CT and likely related to recent tracheostomy placement. Lungs/Pleura: Minimal bibasilar subpleural atelectasis. No focal consolidation, pleural effusion, or pneumothorax. The  central airways are patent. Musculoskeletal: No chest wall abnormality. No acute or significant osseous findings. Review of the MIP images confirms the above findings. CT ABDOMEN and PELVIS FINDINGS No intra-abdominal free air or free fluid. Hepatobiliary: No focal liver abnormality is seen. No gallstones, gallbladder wall thickening, or biliary dilatation. Pancreas: Unremarkable. No pancreatic ductal dilatation or surrounding inflammatory changes. Spleen: Normal in size without focal abnormality. Adrenals/Urinary Tract: The adrenal glands are unremarkable. Probable punctate nonobstructing left renal inferior pole calculus. There is no hydronephrosis on either side. There is symmetric enhancement and excretion of contrast by both kidneys. The visualized ureters appear unremarkable. The urinary bladder is decompressed around a Foley catheter. There is air within the bladder introduced by the Foley. Stomach/Bowel: There is no bowel obstruction or active inflammation. Normal appendix. Vascular/Lymphatic: Moderate aortoiliac atherosclerotic disease. No portal venous gas. There is no adenopathy. Reproductive: The prostate and seminal vesicles are grossly unremarkable. No pelvic mass.  Other: None Musculoskeletal: Irregularity of the lateral aspect of the right iliac wing, likely related to prior fracture or bone graft donation site. No acute osseous pathology. Review of the MIP images confirms the above findings. IMPRESSION: 1. No acute intrathoracic, abdominal, or pelvic pathology. No CT evidence of pulmonary embolism. 2. Air in the upper mediastinum and lower neck likely related to recent tracheostomy placement. Electronically Signed   By: Anner Crete M.D.   On: 09/10/2018 23:04   Ct Abdomen Pelvis W Contrast  Result Date: 09/10/2018 CLINICAL DATA:  52 year old male with concern for laryngeal cancer presents with shortness of breath and stridor. EXAM: CT ANGIOGRAPHY CHEST CT ABDOMEN AND PELVIS WITH CONTRAST  TECHNIQUE: Multidetector CT imaging of the chest was performed using the standard protocol during bolus administration of intravenous contrast. Multiplanar CT image reconstructions and MIPs were obtained to evaluate the vascular anatomy. Multidetector CT imaging of the abdomen and pelvis was performed using the standard protocol during bolus administration of intravenous contrast. CONTRAST:  11mL OMNIPAQUE IOHEXOL 350 MG/ML SOLN COMPARISON:  Chest radiograph dated 09/10/2018 and neck CT dated 09/10/2018 FINDINGS: Evaluation is limited due to streak artifact caused by patient's arms. CTA CHEST FINDINGS Cardiovascular: There is no cardiomegaly or pericardial effusion. The thoracic aorta is unremarkable. There is no CT evidence of pulmonary embolism. Mediastinum/Nodes: No hilar or mediastinal adenopathy. The esophagus is grossly unremarkable. There is air in the upper mediastinum and lower neck which is new compared to the prior CT and likely related to recent tracheostomy placement. Lungs/Pleura: Minimal bibasilar subpleural atelectasis. No focal consolidation, pleural effusion, or pneumothorax. The central airways are patent. Musculoskeletal: No chest wall abnormality. No acute or significant osseous findings. Review of the MIP images confirms the above findings. CT ABDOMEN and PELVIS FINDINGS No intra-abdominal free air or free fluid. Hepatobiliary: No focal liver abnormality is seen. No gallstones, gallbladder wall thickening, or biliary dilatation. Pancreas: Unremarkable. No pancreatic ductal dilatation or surrounding inflammatory changes. Spleen: Normal in size without focal abnormality. Adrenals/Urinary Tract: The adrenal glands are unremarkable. Probable punctate nonobstructing left renal inferior pole calculus. There is no hydronephrosis on either side. There is symmetric enhancement and excretion of contrast by both kidneys. The visualized ureters appear unremarkable. The urinary bladder is decompressed  around a Foley catheter. There is air within the bladder introduced by the Foley. Stomach/Bowel: There is no bowel obstruction or active inflammation. Normal appendix. Vascular/Lymphatic: Moderate aortoiliac atherosclerotic disease. No portal venous gas. There is no adenopathy. Reproductive: The prostate and seminal vesicles are grossly unremarkable. No pelvic mass. Other: None Musculoskeletal: Irregularity of the lateral aspect of the right iliac wing, likely related to prior fracture or bone graft donation site. No acute osseous pathology. Review of the MIP images confirms the above findings. IMPRESSION: 1. No acute intrathoracic, abdominal, or pelvic pathology. No CT evidence of pulmonary embolism. 2. Air in the upper mediastinum and lower neck likely related to recent tracheostomy placement. Electronically Signed   By: Anner Crete M.D.   On: 09/10/2018 23:04   US Venous Img Lower Bilateral  Result Date: 09/10/2018 CLINICAL DATA:  Initial evaluation for lower extremity swelling, edema. EXAM: BILATERAL LOWER EXTREMITY VENOUS DOPPLER ULTRASOUND TECHNIQUE: Gray-scale sonography with graded compression, as well as color Doppler and duplex ultrasound were performed to evaluate the lower extremity deep venous systems from the level of the common femoral vein and including the common femoral, femoral, profunda femoral, popliteal and calf veins including the posterior tibial, peroneal and gastrocnemius veins when visible. The  superficial great saphenous vein was also interrogated. Spectral Doppler was utilized to evaluate flow at rest and with distal augmentation maneuvers in the common femoral, femoral and popliteal veins. COMPARISON:  None. FINDINGS: RIGHT LOWER EXTREMITY Common Femoral Vein: No evidence of thrombus. Normal compressibility, respiratory phasicity and response to augmentation. Saphenofemoral Junction: No evidence of thrombus. Normal compressibility and flow on color Doppler imaging. Profunda  Femoral Vein: No evidence of thrombus. Normal compressibility and flow on color Doppler imaging. Femoral Vein: No evidence of thrombus. Normal compressibility, respiratory phasicity and response to augmentation. Popliteal Vein: No evidence of thrombus. Normal compressibility, respiratory phasicity and response to augmentation. Calf Veins: No evidence of thrombus. Normal compressibility and flow on color Doppler imaging. Superficial Great Saphenous Vein: No evidence of thrombus. Normal compressibility. Venous Reflux:  None. Other Findings:  None. LEFT LOWER EXTREMITY Common Femoral Vein: No evidence of thrombus. Normal compressibility, respiratory phasicity and response to augmentation. Saphenofemoral Junction: No evidence of thrombus. Normal compressibility and flow on color Doppler imaging. Profunda Femoral Vein: No evidence of thrombus. Normal compressibility and flow on color Doppler imaging. Femoral Vein: No evidence of thrombus. Normal compressibility, respiratory phasicity and response to augmentation. Popliteal Vein: No evidence of thrombus. Normal compressibility, respiratory phasicity and response to augmentation. Calf Veins: No evidence of thrombus. Normal compressibility and flow on color Doppler imaging. Superficial Great Saphenous Vein: No evidence of thrombus. Normal compressibility. Venous Reflux:  None. Other Findings:  None. IMPRESSION: No evidence of deep venous thrombosis. Electronically Signed   By: Jeannine Boga M.D.   On: 09/10/2018 23:12   Dg Chest Port 1 View  Result Date: 09/13/2018 CLINICAL DATA:  52 y/o M; aspiration into airway. NG tube placement. EXAM: PORTABLE CHEST 1 VIEW ABDOMEN RADIOGRAPH, 1 VIEW COMPARISON:  09/13/2018 chest radiograph. 09/12/2018 abdomen radiograph. FINDINGS: Chest: Stable cardiac silhouette given projection and technique within normal limits. Tracheostomy tube projecting over trachea. Left basilar ill-defined retrocardiac opacity. No pleural effusion or  pneumothorax. Enteric tube tip extends below the field of view into the abdomen. No acute osseous abnormality identified. Abdomen: Enteric tube tip projects over gastric body. Upper abdominal bowel gas pattern is normal. IMPRESSION: Enteric tube tip projects over gastric body. Ill-defined left basilar opacity may represent atelectasis, pneumonia, or aspiration. Electronically Signed   By: Kristine Garbe M.D.   On: 09/13/2018 22:06   Dg Chest Port 1 View  Result Date: 09/13/2018 CLINICAL DATA:  Acute respiratory failure EXAM: PORTABLE CHEST 1 VIEW COMPARISON:  09/10/2018 FINDINGS: NG tube enters the stomach. Tracheostomy is unchanged. Bibasilar atelectasis, increased since prior study. Heart is borderline in size. No visible effusions or acute bony abnormality. IMPRESSION: Increasing bibasilar atelectasis. Electronically Signed   By: Rolm Baptise M.D.   On: 09/13/2018 07:38   Dg Chest Port 1 View  Result Date: 09/10/2018 CLINICAL DATA:  Tracheostomy placement. EXAM: PORTABLE CHEST 1 VIEW COMPARISON:  Chest x-ray from earlier same day FINDINGS: Tracheostomy tube appears appropriately positioned in the midline with tip at the level of the clavicles. Heart size and mediastinal contours are within normal limits. Lungs are clear. No pleural effusion or pneumothorax seen. Osseous structures about the chest are unremarkable. IMPRESSION: Tracheostomy tube appears appropriately positioned in the midline. Lungs are clear. Electronically Signed   By: Franki Cabot M.D.   On: 09/10/2018 15:53   Dg Abd Portable 1v  Result Date: 09/12/2018 CLINICAL DATA:  NG tube placement. EXAM: PORTABLE ABDOMEN - 1 VIEW COMPARISON:  CT scan 09/10/2018 FINDINGS: The NG tube is  coursing down the esophagus and into the stomach. It is looped back on itself with the tip in the body region. Moderate air throughout the small bowel and colon, likely diffuse ileus. IMPRESSION: NG tube tip is in the body region the stomach. Ileus  bowel gas pattern. Electronically Signed   By: Marijo Sanes M.D.   On: 09/12/2018 13:46    Labs:  CBC: Recent Labs    09/12/18 0423 09/13/18 0531 09/15/18 0446 09/16/18 0353  WBC 15.4* 12.3* 9.5 10.8*  HGB 12.3* 13.8 13.9 13.6  HCT 37.5* 42.4 41.9 41.3  PLT 223 202 172 214    COAGS: No results for input(s): INR, APTT in the last 8760 hours.  BMP: Recent Labs    09/12/18 0423 09/14/18 0526 09/15/18 0446 09/16/18 0353  NA 137 140 137 135  K 4.5 3.5 3.4* 3.6  CL 103 107 107 106  CO2 28 26 22 23   GLUCOSE 146* 144* 111* 90  BUN 28* 21* 18 18  CALCIUM 8.4* 8.2* 8.4* 8.1*  CREATININE 0.67 0.57* 0.53* 0.55*  GFRNONAA >60 >60 >60 >60  GFRAA >60 >60 >60 >60    LIVER FUNCTION TESTS: Recent Labs    08/23/18 2330 09/10/18 1046 09/11/18 0426 09/12/18 0423  BILITOT 0.5 0.5 0.3  --   AST 22 22 16   --   ALT 20 19 15   --   ALKPHOS 60 58 47  --   PROT 7.9 8.1 6.8  --   ALBUMIN 3.8 4.0 3.2* 3.2*    TUMOR MARKERS: No results for input(s): AFPTM, CEA, CA199, CHROMGRNA in the last 8760 hours.  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  Balloon retention gastrostomy tube placement to follow.  Thank you for this interesting consult.  I greatly enjoyed meeting Alan Chung and look forward to participating in their care.  A copy of this report was sent to the requesting provider on this date.  Electronically Signed: Art A Christine Morton, MD 09/16/2018, 1:53 PM   I spent a total of 55 Miinutes  in face to face in clinical consultation, greater than 50% of which was counseling/coordinating care for gastrostomy tube placement.

## 2018-09-16 NOTE — Progress Notes (Signed)
[facility] [place] NAME: Alan Chung    MR#:  [id number]  DATE OF BIRTH:  January 16, 1966  SUBJECTIVE:    Endorsing a lot of throat pain this morning around his trach.  Secretions have improved.  Denies any shortness of breath.  REVIEW OF SYSTEMS:   ROS-unable to obtain due to trach  DRUG ALLERGIES:   Allergies  Allergen Reactions  . Tramadol Itching    VITALS:  Blood pressure (!) 166/85, pulse (!) 57, temperature 98.5 F (36.9 C), temperature source Oral, resp. rate 18, height 6\' 1"  (1.854 m), weight 91.8 kg, SpO2 93 %.  PHYSICAL EXAMINATION:  GENERAL:  52 y.o.-year-old patient lying in the bed, in NAD, +coughing intermittently EYES: Pupils equal, round, reactive to light . No scleral icterus. Extraocular muscles intact.  HEENT: Head atraumatic, normocephalic. Oropharynx and nasopharynx clear.  NECK:  Supple, no jugular venous distention. No thyroid enlargement, no tenderness. + Trach with moderate clear/tan secretions. LUNGS: + Referred upper airway noises, no wheezing, rales,rhonchi or crepitation. No use of accessory muscles of respiration.  CARDIOVASCULAR: RRR, S1, S2 normal. No murmurs, rubs, or gallops.  ABDOMEN: Soft, nontender, nondistended. Bowel sounds present. No organomegaly or mass.  EXTREMITIES: No pedal edema, cyanosis, or clubbing.  NEUROLOGIC: Alert, follows commands, answers questions with yes or no.  PSYCHIATRIC: Difficult to assess  SKIN: No obvious rash, lesion, or ulcer.   LABORATORY PANEL:   CBC Recent Labs  Lab 09/16/18 0353  WBC 10.8*  HGB 13.6  HCT 41.3  PLT 214   ------------------------------------------------------------------------------------------------------------------  Chemistries  Recent Labs  Lab 09/11/18 0426  09/14/18 0526  09/16/18 0353  NA 138   < > 140   < > 135  K 3.8   < > 3.5   < > 3.6  CL 101   < > 107   < > 106  CO2 29   < > 26   < > 23  GLUCOSE 163*   < >  144*   < > 90  BUN 11   < > 21*   < > 18  CREATININE 0.66   < > 0.57*   < > 0.55*  CALCIUM 8.7*   < > 8.2*   < > 8.1*  MG 1.9   < > 2.1  --   --   AST 16  --   --   --   --   ALT 15  --   --   --   --   ALKPHOS 47  --   --   --   --   BILITOT 0.3  --   --   --   --    < > = values in this interval not displayed.   ------------------------------------------------------------------------------------------------------------------  Cardiac Enzymes Recent Labs  Lab 09/10/18 1046  TROPONINI <0.03   ------------------------------------------------------------------------------------------------------------------  RADIOLOGY:  Dg Chest 1 View  Result Date: 09/15/2018 CLINICAL DATA:  Aspiration pneumonia. EXAM: CHEST  1 VIEW COMPARISON:  Radiograph of September 13, 2018. FINDINGS: The heart size and mediastinal contours are within normal limits. Tracheostomy and nasogastric tube are unchanged in position. No pneumothorax or pleural effusion is noted. Both lungs are clear. The visualized skeletal structures are unremarkable. IMPRESSION: No active disease. Electronically Signed   By: Marijo Conception, M.D.   On: 09/15/2018 07:53    EKG:   Orders placed or performed during the hospital encounter of 09/10/18  . EKG 12-Lead  .  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  CT head/chest/abdomen/pelvis negative for mets. -Continue trach collar -ENT and oncology following  -IR consulted for PEG tube placement -SLP following -Trach change after feeding tube placement -Plan for likely discharge home and follow-up at [facility]  Aspiration pneumonia-chest x-ray with left basilar opacity. Repeat CXR negative. -Continue Unasyn, plan for total 7-day course -Duonebs as needed  Hyperglycemia-blood sugars mildly elevated, no diagnosis of diabetes. A1c 5.5% this admission -Continue sliding scale  insulin  All the records are reviewed and case discussed with Care Management/Social Workerr. Management plans discussed with the patient, family and they are in agreement.  CODE STATUS: Full code  TOTAL TIME TAKING CARE OF THIS PATIENT: 32 minutes.   POSSIBLE D/C IN 1-2 DAYS, DEPENDING ON CLINICAL CONDITION.   Berna Spare Angeligue Bowne M.D on 09/16/2018 at 12:44 PM  Between 7am to 6pm - Pager - [phone]  After 6pm go to [email or web address] - password [email or web address]  [facility]  Office  [phone]  CC: Primary care physician; System, Provider Not In   Note: This dictation was prepared with Dragon dictation along with smaller phrase technology. Any transcriptional errors that result from this process are unintentional.

## 2018-09-16 NOTE — Procedures (Signed)
18 Fr Balloon retention gastrostomy tube. EBL 0 Comp 0

## 2018-09-16 NOTE — Progress Notes (Signed)
.. 09/16/2018 8:05 AM  Alan Chung [id number]  Post-Op Day 6    Temp:  [98.5 F (36.9 C)-98.8 F (37.1 C)] 98.5 F (36.9 C) (12/12 0426) Pulse Rate:  [57-65] 57 (12/12 0426) Resp:  [18] 18 (12/11 1947) BP: (166-177)/(85-89) 166/85 (12/12 0426) SpO2:  [93 %-100 %] 93 % (12/12 0426) FiO2 (%):  [28 %] 28 % (12/11 2029) Weight:  [91.8 kg] 91.8 kg (12/12 0600),     Intake/Output Summary (Last 24 hours) at 09/16/2018 0805 Last data filed at 09/16/2018 0406 Gross per 24 hour  Intake 1106.06 ml  Output 825 ml  Net 281.06 ml    Results for orders placed or performed during the hospital encounter of 09/10/18 (from the past 24 hour(s))  Glucose, capillary     Status: None   Collection Time: 09/15/18 11:38 AM  Result Value Ref Range   Glucose-Capillary 96 70 - 99 mg/dL  Glucose, capillary     Status: None   Collection Time: 09/15/18  4:55 PM  Result Value Ref Range   Glucose-Capillary 95 70 - 99 mg/dL  Glucose, capillary     Status: None   Collection Time: 09/15/18  9:01 PM  Result Value Ref Range   Glucose-Capillary 81 70 - 99 mg/dL  Glucose, capillary     Status: None   Collection Time: 09/16/18 12:16 AM  Result Value Ref Range   Glucose-Capillary 86 70 - 99 mg/dL  CBC     Status: Abnormal   Collection Time: 09/16/18  3:53 AM  Result Value Ref Range   WBC 10.8 (H) 4.0 - 10.5 K/uL   RBC 4.86 4.22 - 5.81 MIL/uL   Hemoglobin 13.6 13.0 - 17.0 g/dL   HCT 41.3 39.0 - 52.0 %   MCV 85.0 80.0 - 100.0 fL   MCH 28.0 26.0 - 34.0 pg   MCHC 32.9 30.0 - 36.0 g/dL   RDW 13.1 11.5 - 15.5 %   Platelets 214 150 - 400 K/uL   nRBC 0.0 0.0 - 0.2 %  Basic metabolic panel     Status: Abnormal   Collection Time: 09/16/18  3:53 AM  Result Value Ref Range   Sodium 135 135 - 145 mmol/L   Potassium 3.6 3.5 - 5.1 mmol/L   Chloride 106 98 - 111 mmol/L   CO2 23 22 - 32 mmol/L   Glucose, Bld 90 70 - 99 mg/dL   BUN 18 6 - 20 mg/dL   Creatinine, Ser 0.55 (L) 0.61 - 1.24 mg/dL   Calcium  8.1 (L) 8.9 - 10.3 mg/dL   GFR calc non Af Amer >60 >60 mL/min   GFR calc Af Amer >60 >60 mL/min   Anion gap 6 5 - 15  Glucose, capillary     Status: None   Collection Time: 09/16/18  4:24 AM  Result Value Ref Range   Glucose-Capillary 72 70 - 99 mg/dL  Glucose, capillary     Status: None   Collection Time: 09/16/18  7:33 AM  Result Value Ref Range   Glucose-Capillary 86 70 - 99 mg/dL    SUBJECTIVE:  No acute events overnight.  IR consult placed by medicine yesterday for IR gastrostomy tube  OBJECTIVE:  GEN-  NAD NECK-  Trach secure and patent with cuff deflated  IMPRESSION:  S/p trach for T4aN2 SCCA of Supraglottis  PLAN:  1)  Tumor board today although if surgery is opted for, will need to be done at a tertiary center 2)  IR consulted for  gastrostomy tube placement given inability for pull PEG- to be done today 3)  Speech evaluated patient and did not tolerated PMV due to obstruction of exhalation, can verbalize some with finger occlusion 4)  Discussed with Dr. Ihor Austin at [facility] who is reviewing scans.  T4aN2 SCCA of supraglottis.  Will see next week and if still inpatient can transfer or if discharged over weekend will see as outpatient. 5)  Will need discharge planning for trach/PEG supplies   Alan Chung 09/16/2018, 8:05 AM

## 2018-09-16 NOTE — Progress Notes (Signed)
Pt transported to special procedure via transport services and myself, RN. Pt remains to complain of pain in throat. Prior to transport inner canula and gauze changed prior to transport. Report provided with arrival on special procedures unit

## 2018-09-16 NOTE — Plan of Care (Signed)

## 2018-09-17 LAB — CBC
HEMATOCRIT: 40.8 % (ref 39.0–52.0)
Hemoglobin: 13.5 g/dL (ref 13.0–17.0)
MCH: 27.8 pg (ref 26.0–34.0)
MCHC: 33.1 g/dL (ref 30.0–36.0)
MCV: 84.1 fL (ref 80.0–100.0)
Platelets: 208 10*3/uL (ref 150–400)
RBC: 4.85 MIL/uL (ref 4.22–5.81)
RDW: 12.9 % (ref 11.5–15.5)
WBC: 10.8 10*3/uL — ABNORMAL HIGH (ref 4.0–10.5)
nRBC: 0 % (ref 0.0–0.2)

## 2018-09-17 LAB — BASIC METABOLIC PANEL
Anion gap: 6 (ref 5–15)
BUN: 12 mg/dL (ref 6–20)
CO2: 24 mmol/L (ref 22–32)
Calcium: 8.1 mg/dL — ABNORMAL LOW (ref 8.9–10.3)
Chloride: 104 mmol/L (ref 98–111)
Creatinine, Ser: 0.66 mg/dL (ref 0.61–1.24)
GFR calc Af Amer: >60 mL/min (ref >60)
GFR calc non Af Amer: >60 mL/min (ref >60)
GLUCOSE: 108 mg/dL — AB (ref 70–99)
Potassium: 3.4 mmol/L — ABNORMAL LOW (ref 3.5–5.1)
Sodium: 134 mmol/L — ABNORMAL LOW (ref 135–145)

## 2018-09-17 LAB — GLUCOSE, CAPILLARY
GLUCOSE-CAPILLARY: 96 mg/dL (ref 70–99)
GLUCOSE-CAPILLARY: 109 mg/dL — AB (ref 70–99)
Glucose-Capillary: 102 mg/dL — ABNORMAL HIGH (ref 70–99)
Glucose-Capillary: 126 mg/dL — ABNORMAL HIGH (ref 70–99)
Glucose-Capillary: 93 mg/dL (ref 70–99)
Glucose-Capillary: 127 mg/dL — ABNORMAL HIGH (ref 70–99)

## 2018-09-17 MED ORDER — POTASSIUM CHLORIDE 10 MEQ/100ML IV SOLN
10.0000 meq | INTRAVENOUS | Status: AC
  Administered 2018-09-17 (×3): 10 meq via INTRAVENOUS
  Filled 2018-09-17 (×2): qty 100

## 2018-09-17 MED ORDER — OSMOLITE 1.5 CAL PO LIQD
237.0000 mL | Freq: Four times a day (QID) | ORAL | Status: AC
  Administered 2018-09-18: 11:00:00 237 mL

## 2018-09-17 MED ORDER — ENOXAPARIN SODIUM 40 MG/0.4ML ~~LOC~~ SOLN
40.0000 mg | SUBCUTANEOUS | Status: DC
  Administered 2018-09-17 – 2018-09-19 (×2): 40 mg via SUBCUTANEOUS
  Filled 2018-09-17 (×3): qty 0.4

## 2018-09-17 MED ORDER — TRAZODONE HCL 50 MG PO TABS
50.0000 mg | ORAL_TABLET | Freq: Every evening | ORAL | Status: DC | PRN
  Administered 2018-09-17 – 2018-09-20 (×3): 50 mg via ORAL
  Filled 2018-09-17 (×3): qty 1

## 2018-09-17 MED ORDER — AMOXICILLIN 250 MG/5ML PO SUSR
500.0000 mg | Freq: Three times a day (TID) | ORAL | Status: DC
  Administered 2018-09-17 – 2018-09-20 (×10): 500 mg
  Filled 2018-09-17 (×16): qty 10

## 2018-09-17 MED ORDER — OSMOLITE 1.5 CAL PO LIQD
120.0000 mL | Freq: Four times a day (QID) | ORAL | Status: AC
  Administered 2018-09-17 (×2): 120 mL

## 2018-09-17 NOTE — Care Management (Signed)
[name] will need to learn trach care prior to discharge. Also, will need to be tolerating his tube feeds.  [facility] will be providing charity care and supplies. Shelbie Ammons RN MSN CCM Care Management [phone]

## 2018-09-17 NOTE — Progress Notes (Signed)
.. 09/17/2018 7:20 AM  Alan Chung [id number]  Post-Op Day 7    Temp:  [98.1 F (36.7 C)-98.8 F (37.1 C)] 98.5 F (36.9 C) (12/13 0427) Pulse Rate:  [56-67] 57 (12/13 0427) Resp:  [18-24] 18 (12/13 0427) BP: (134-187)/(81-102) 144/82 (12/13 0427) SpO2:  [94 %-99 %] 97 % (12/13 0427) FiO2 (%):  [28 %] 28 % (12/13 0427) Weight:  [94.6 kg] 94.6 kg (12/13 0605),     Intake/Output Summary (Last 24 hours) at 09/17/2018 0720 Last data filed at 09/17/2018 0415 Gross per 24 hour  Intake 4181.93 ml  Output 1600 ml  Net 2581.93 ml    Results for orders placed or performed during the hospital encounter of 09/10/18 (from the past 24 hour(s))  Glucose, capillary     Status: None   Collection Time: 09/16/18  7:33 AM  Result Value Ref Range   Glucose-Capillary 86 70 - 99 mg/dL  Glucose, capillary     Status: Abnormal   Collection Time: 09/16/18 11:34 AM  Result Value Ref Range   Glucose-Capillary 101 (H) 70 - 99 mg/dL  Glucose, capillary     Status: None   Collection Time: 09/16/18  4:38 PM  Result Value Ref Range   Glucose-Capillary 71 70 - 99 mg/dL  Glucose, capillary     Status: Abnormal   Collection Time: 09/16/18  8:42 PM  Result Value Ref Range   Glucose-Capillary 102 (H) 70 - 99 mg/dL  Glucose, capillary     Status: Abnormal   Collection Time: 09/17/18  1:03 AM  Result Value Ref Range   Glucose-Capillary 102 (H) 70 - 99 mg/dL  CBC     Status: Abnormal   Collection Time: 09/17/18  3:39 AM  Result Value Ref Range   WBC 10.8 (H) 4.0 - 10.5 K/uL   RBC 4.85 4.22 - 5.81 MIL/uL   Hemoglobin 13.5 13.0 - 17.0 g/dL   HCT 40.8 39.0 - 52.0 %   MCV 84.1 80.0 - 100.0 fL   MCH 27.8 26.0 - 34.0 pg   MCHC 33.1 30.0 - 36.0 g/dL   RDW 12.9 11.5 - 15.5 %   Platelets 208 150 - 400 K/uL   nRBC 0.0 0.0 - 0.2 %  Basic metabolic panel     Status: Abnormal   Collection Time: 09/17/18  3:39 AM  Result Value Ref Range   Sodium 134 (L) 135 - 145 mmol/L   Potassium 3.4 (L) 3.5 - 5.1  mmol/L   Chloride 104 98 - 111 mmol/L   CO2 24 22 - 32 mmol/L   Glucose, Bld 108 (H) 70 - 99 mg/dL   BUN 12 6 - 20 mg/dL   Creatinine, Ser 0.66 0.61 - 1.24 mg/dL   Calcium 8.1 (L) 8.9 - 10.3 mg/dL   GFR calc non Af Amer >60 >60 mL/min   GFR calc Af Amer >60 >60 mL/min   Anion gap 6 5 - 15    SUBJECTIVE:  No acute events overnight.  IR gastrostomy tube yesterday.    OBJECTIVE:  GEN-  NAD, sitting in bed NECK-  Trach secure and patent with salivary secretions present.  Trach changed to size 8 cuffless Shiley Fenestrated trach without issue.  Tract well healed.  IMPRESSION:  S/p trach/gastrostomy tube for T4aN2Mx SCCA of supraglottis  PLAN:  OK to discharge home per ENT perspective once home health and supplies and trach teaching has been done.  [facility] wants to see patient mid next week and can transfer if he  is still in patient or as outpatient.  Would prefer outpatient as can try and get PET scan as outpatient prior to appointment if possible.  Begin tube feeds as per IR note.  Patient is doing self suctioning.  I will like to see the patient back in my office on Monday 09/20/2018.  Alan Chung 09/17/2018, 7:20 AM

## 2018-09-17 NOTE — Evaluation (Signed)
Physical Therapy Evaluation Patient Details Name: Alan Chung MRN: [id number] DOB: August 04, 1966 Today's Date: 09/17/2018   History of Present Illness  Pt presented with respiratory difficulty and was found to have a large laryngeal mass. Tracheostomy completed. PMH includes HTN and former smoker    Clinical Impression  Pt admitted with above diagnosis. Pt currently with functional limitations due to the deficits listed below (see PT Problem List). Alan Chung was very pleasant and agreeable to work with therapy.  He ambulated 160 ft with RW with SpO2 remaining at or above 97% on RA.  Pt demonstrates narrow BOS while ambulating, placing him at increased risk of falling and thus recommending OPPT at d/c to address balance and safety with gait. Pt will benefit from skilled PT to increase their independence and safety with mobility to allow discharge to the venue listed below.      Follow Up Recommendations Outpatient PT    Equipment Recommendations  Rolling walker with 5" wheels    Recommendations for Other Services       Precautions / Restrictions Precautions Precautions: Fall;Other (comment) Precaution Comments: trach, gastrostomy tube Restrictions Weight Bearing Restrictions: No      Mobility  Bed Mobility Overal bed mobility: Modified Independent             General bed mobility comments: deferred; pt presented in chair  Transfers Overall transfer level: Needs assistance Equipment used: None Transfers: Sit to/from Stand Sit to Stand: Min guard;Supervision         General transfer comment: Min guard for safety.  Pt remains steady.    Ambulation/Gait Ambulation/Gait assistance: Min guard Gait Distance (Feet): 160 Feet Assistive device: Rolling walker (2 wheeled) Gait Pattern/deviations: Narrow base of support;Trunk flexed     General Gait Details: Trunk slightly flexed.  Very narrow BOS with occasional crossover gait especially when turning.   Stairs             Wheelchair Mobility    Modified Rankin (Stroke Patients Only)       Balance Overall balance assessment: Needs assistance Sitting-balance support: No upper extremity supported;Feet supported Sitting balance-Leahy Scale: Good     Standing balance support: No upper extremity supported Standing balance-Leahy Scale: Good Standing balance comment: Pt able to stand without UE support but would likely lose balance with perturbation or challenges to balance                             Pertinent Vitals/Pain Pain Assessment: Faces Faces Pain Scale: Hurts a little bit Pain Location: neck;  Pain Intervention(s): Monitored during session;Repositioned    Home Living Family/patient expects to be discharged to:: Private residence Living Arrangements: Alone Available Help at Discharge: Family;Available PRN/intermittently Type of Home: Mobile home Home Access: Stairs to enter Entrance Stairs-Rails: Right;Left;Can reach both Entrance Stairs-Number of Steps: 3 Home Layout: One level Home Equipment: Walker - standard;Cane - single point;Hand held shower head      Prior Function Level of Independence: Independent         Comments: Ambulating without AD.  Working odds and ends jobs.  Not driving.  Ind with ADLs.  No falls in the past 3 months.      Hand Dominance        Extremity/Trunk Assessment   Upper Extremity Assessment Upper Extremity Assessment: Overall WFL for tasks assessed    Lower Extremity Assessment Lower Extremity Assessment: Overall WFL for tasks assessed    Cervical / Trunk  Assessment Cervical / Trunk Assessment: Normal  Communication   Communication: Tracheostomy  Cognition Arousal/Alertness: Awake/alert Behavior During Therapy: WFL for tasks assessed/performed Overall Cognitive Status: Within Functional Limits for tasks assessed                                        General Comments General comments (skin  integrity, edema, etc.): SpO2 remains at or above 97% on RA during session. Pt with trach collar in room but RN reports it is just for humidification purposes and can be left in room as pt ambulates.      Exercises Other Exercises Other Exercises: Pt educated in energy conservation strategies including activity pacing, activity prioritization, sitting for tasks, and taking breaks. Other Exercises: Pt educated in UB/LB body clothing mgt    Assessment/Plan    PT Assessment Patient needs continued PT services  PT Problem List Cardiopulmonary status limiting activity;Decreased balance;Decreased knowledge of use of DME;Decreased safety awareness       PT Treatment Interventions DME instruction;Gait training;Stair training;Functional mobility training;Therapeutic activities;Therapeutic exercise;Balance training;Neuromuscular re-education;Patient/family education    PT Goals (Current goals can be found in the Care Plan section)  Acute Rehab PT Goals Patient Stated Goal: to go home PT Goal Formulation: With patient Time For Goal Achievement: 10/01/18 Potential to Achieve Goals: Good    Frequency Min 2X/week   Barriers to discharge        Co-evaluation               AM-PAC PT "6 Clicks" Mobility  Outcome Measure Help needed turning from your back to your side while in a flat bed without using bedrails?: None Help needed moving from lying on your back to sitting on the side of a flat bed without using bedrails?: None Help needed moving to and from a bed to a chair (including a wheelchair)?: A Little Help needed standing up from a chair using your arms (e.g., wheelchair or bedside chair)?: A Little Help needed to walk in hospital room?: A Little Help needed climbing 3-5 steps with a railing? : A Little 6 Click Score: 20    End of Session Equipment Utilized During Treatment: Gait belt Activity Tolerance: Patient tolerated treatment well Patient left: in chair;with call  bell/phone within reach;with chair alarm set;Other (comment)(Student OT in room ) Nurse Communication: Mobility status;Other (comment)(SpO2) PT Visit Diagnosis: Unsteadiness on feet (R26.81);Other abnormalities of gait and mobility (R26.89)    Time: 9678-9381 PT Time Calculation (min) (ACUTE ONLY): 42 min   Charges:   PT Evaluation $PT Eval Moderate Complexity: 1 Mod PT Treatments $Gait Training: 8-22 mins $Therapeutic Activity: 8-22 mins        Session was performed by student PT, Belva Crome, and directed, overseen, and documented by this PT.  Collie Siad PT, DPT 09/17/2018, 4:23 PM

## 2018-09-17 NOTE — Progress Notes (Addendum)
[place] at [place] NAME: Alan Chung    MR#:  [id number]  DATE OF BIRTH:  10-07-1965  SUBJECTIVE:    Secretions are improving.  Patient states that he feels much better today.  The pain around his trach is also improving.  Has been on trach collar.  REVIEW OF SYSTEMS:   ROS-difficult to obtain due to trach  DRUG ALLERGIES:   Allergies  Allergen Reactions  . Tramadol Itching    VITALS:  Blood pressure (!) 151/77, pulse (!) 58, temperature 98.3 F (36.8 C), temperature source Oral, resp. rate 18, height 6\' 1"  (1.854 m), weight 94.6 kg, SpO2 95 %.  PHYSICAL EXAMINATION:  GENERAL:  52 y.o.-year-old patient lying in the bed, in NAD EYES: Pupils equal, round, reactive to light . No scleral icterus. Extraocular muscles intact.  HEENT: Head atraumatic, normocephalic. Oropharynx and nasopharynx clear.  NECK:  Supple, no jugular venous distention. No thyroid enlargement, no tenderness. + Trach in place LUNGS: + Referred upper airway noises, no wheezing, rales,rhonchi or crepitation. No use of accessory muscles of respiration.  CARDIOVASCULAR: RRR, S1, S2 normal. No murmurs, rubs, or gallops.  ABDOMEN: Soft, nontender, nondistended. Bowel sounds present. No organomegaly or mass. + Gastrostomy tube in place without any surrounding erythema. EXTREMITIES: No pedal edema, cyanosis, or clubbing.  NEUROLOGIC: Alert, follows commands, answers questions with yes or no.  PSYCHIATRIC: Difficult to assess  SKIN: No obvious rash, lesion, or ulcer.   LABORATORY PANEL:   CBC Recent Labs  Lab 09/17/18 0339  WBC 10.8*  HGB 13.5  HCT 40.8  PLT 208   ------------------------------------------------------------------------------------------------------------------  Chemistries  Recent Labs  Lab 09/11/18 0426  09/14/18 0526  09/17/18 0339  NA 138   < > 140   < > 134*  K 3.8   < > 3.5   < > 3.4*  CL 101   < > 107   < > 104  CO2 29   <  > 26   < > 24  GLUCOSE 163*   < > 144*   < > 108*  BUN 11   < > 21*   < > 12  CREATININE 0.66   < > 0.57*   < > 0.66  CALCIUM 8.7*   < > 8.2*   < > 8.1*  MG 1.9   < > 2.1  --   --   AST 16  --   --   --   --   ALT 15  --   --   --   --   ALKPHOS 47  --   --   --   --   BILITOT 0.3  --   --   --   --    < > = values in this interval not displayed.   ------------------------------------------------------------------------------------------------------------------  Cardiac Enzymes No results for input(s): TROPONINI in the last 168 hours. ------------------------------------------------------------------------------------------------------------------  RADIOLOGY:  Ir Gastrostomy Tube Mod Sed  Result Date: 09/16/2018 INDICATION: Laryngeal tumor EXAM: PERC PLACEMENT GASTROSTOMY MEDICATIONS: The patient is currently on IV antibiotics ANESTHESIA/SEDATION: Versed 3 mg IV; Fentanyl 150 mcg IV Moderate Sedation Time:  20 minutes The patient was continuously monitored during the procedure by the interventional radiology nurse under my direct supervision. CONTRAST:  51mL ISOVUE-300 IOPAMIDOL (ISOVUE-300) INJECTION 61% - administered into the gastric lumen. FLUOROSCOPY TIME:  Fluoroscopy Time: 3 minutes 6 seconds (124 mGy). COMPLICATIONS: None immediate. PROCEDURE: The procedure, risks, benefits, and alternatives were explained  to the patient. Questions regarding the procedure were encouraged and answered. The patient understands and consents to the procedure. The epigastrium was prepped with Betadine in a sterile fashion, and a sterile drape was applied covering the operative field. A sterile gown and sterile gloves were used for the procedure. 1 mg glucagon was administered. The balloon was distended with gas via the NG tube. 1% lidocaine was utilized for local anesthesia. Under fluoroscopic guidance, an 18 gauge needle was advanced into the lumen of the stomach via the anterior wall. A T tack faster was  then advanced through the needle. This was repeated and additional 2 times for 3 T tacks. This functioned as a pexy. Under fluoroscopic guidance, an 18 gauge needle was advanced into the lumen of the stomach. It was removed over an Amplatz wire. The tract was dilated to 20 [place]. A 20 French peel-away sheath was inserted over the wire into the stomach. An 52 French balloon retention gastrostomy tube was then advanced through the peel-away sheath. The peel-away sheath was removed. 10 cc was utilized to insufflate the balloon. Contrast was then injected into the gastrostomy tube. FINDINGS: Images demonstrate placement of an 61 French balloon retention gastrostomy tube into the lumen of the stomach. Contrast fills the lumen of the stomach. IMPRESSION: Successful placement of an 48 French balloon retention gastrostomy tube. Electronically Signed   By: Marybelle Killings M.D.   On: 09/16/2018 14:57    EKG:   Orders placed or performed during the hospital encounter of 09/10/18  . EKG 12-Lead  . 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  CT head/chest/abdomen/pelvis negative for mets.  Gastrostomy tube placed 12/12. -Continue trach collar -ENT and oncology following  -Will begin tube feeds via the gastrostomy tube today -SLP following -Continue trach teaching -[facility] to see patient mid next week -Needs PET scan as outpatient per oncology -Follow-up with ENT on 09/20/2018  Aspiration pneumonia- chest x-ray with left basilar opacity. Repeat CXR negative. -Transition from Unasyn to Amoxicillin today, per sputum culture results -Duonebs as needed  Hyperglycemia-blood sugars mildly elevated, no diagnosis of diabetes. A1c 5.5% this admission -Continue sliding scale insulin  Generalized weakness- likely due to deconditioning -PT/OT consult placed  All the records are reviewed and case discussed  with Care Management/Social Workerr. Management plans discussed with the patient, family and they are in agreement.  CODE STATUS: Full code  TOTAL TIME TAKING CARE OF THIS PATIENT: 35 minutes.   POSSIBLE D/C IN 1-2 DAYS, DEPENDING ON CLINICAL CONDITION.   Berna Spare  M.D on 09/17/2018 at 1:50 PM  Between 7am to 6pm - Pager - [phone]  After 6pm go to [email or web address] - password [email or web address]  [facility]  Office  [phone]  CC: Primary care physician; System, Provider Not In   Note: This dictation was prepared with Dragon dictation along with smaller phrase technology. Any transcriptional errors that result from this process are unintentional.

## 2018-09-17 NOTE — Evaluation (Signed)
Occupational Therapy Evaluation Patient Details Name: Alan Chung MRN: [id number] DOB: 11/25/1965 Today's Date: 09/17/2018    History of Present Illness Pt presented with respiratory difficulty and was found to have a large laryngeal mass. Tracheostomy completed. PMH includes HTN and former smoker   Clinical Impression   Pt seen for OT evaluation this date. Prior to hospital admission, pt was independent with all mobility and ADL/IADL tasks.  Currently pt demonstrates impairments in (see OT Problem List below) and is MOD I for ADLs. Pt will require education/training for G-tube mgt for self-feeding.   Pt instructed in energy conservation strategies including activity pacing, activity prioritization, sitting for tasks, and taking breaks and  in UB/LB body clothing mgt. Pt verbalized understanding of all education presented at this time. No further OT services are needed during this hospitalization. Will sign off. Pt will not require further services upon d/c.    Follow Up Recommendations  No OT follow up    Equipment Recommendations  None recommended by OT    Recommendations for Other Services       Precautions / Restrictions Precautions Precautions: Fall;Other (comment) Precaution Comments: trach, gastrostomy tube Restrictions Weight Bearing Restrictions: No      Mobility Bed Mobility               General bed mobility comments: deferred; pt presented in chair  Transfers Overall transfer level: Needs assistance Equipment used: None Transfers: Sit to/from Stand Sit to Stand: Min guard;Supervision              Balance Overall balance assessment: Needs assistance Sitting-balance support: No upper extremity supported;Feet supported Sitting balance-Leahy Scale: Good     Standing balance support: No upper extremity supported Standing balance-Leahy Scale: Good                             ADL either performed or assessed with clinical judgement    ADL Overall ADL's : Modified independent                                       General ADL Comments: Pt will require education/training for G-tube mgt for self-feeding.     Vision Baseline Vision/History: Wears glasses Wears Glasses: Reading only Patient Visual Report: No change from baseline       Perception     Praxis      Pertinent Vitals/Pain Pain Assessment: Faces Faces Pain Scale: Hurts a little bit Pain Location: neck;  Pain Intervention(s): Monitored during session;Repositioned     Hand Dominance     Extremity/Trunk Assessment Upper Extremity Assessment Upper Extremity Assessment: Overall WFL for tasks assessed   Lower Extremity Assessment Lower Extremity Assessment: Overall WFL for tasks assessed       Communication Communication Communication: Tracheostomy   Cognition Arousal/Alertness: Awake/alert Behavior During Therapy: WFL for tasks assessed/performed Overall Cognitive Status: Within Functional Limits for tasks assessed                                     General Comments       Exercises Other Exercises Other Exercises: Pt educated in energy conservation strategies including activity pacing, activity prioritization, sitting for tasks, and taking breaks. Other Exercises: Pt educated in UB/LB body clothing mgt    Shoulder Instructions  Home Living Family/patient expects to be discharged to:: Private residence Living Arrangements: Alone Available Help at Discharge: Family;Available PRN/intermittently Type of Home: Mobile home Home Access: Stairs to enter Entrance Stairs-Number of Steps: 3 Entrance Stairs-Rails: Right;Left;Can reach both Home Layout: One level     Bathroom Shower/Tub: Teacher, early years/pre: Handicapped height     Home Equipment: Walker - standard;Cane - single point;Hand held shower head          Prior Functioning/Environment Level of Independence: Independent         Comments: Ambulating without AD.  Working odds and ends jobs.  Not driving.  Ind with ADLs.  No falls in the past 3 months.         OT Problem List: Decreased knowledge of precautions;Pain;Impaired balance (sitting and/or standing)      OT Treatment/Interventions:      OT Goals(Current goals can be found in the care plan section) Acute Rehab OT Goals Patient Stated Goal: to go home OT Goal Formulation: All assessment and education complete, DC therapy Potential to Achieve Goals: Good  OT Frequency:     Barriers to D/C:            Co-evaluation              AM-PAC OT "6 Clicks" Alan Activity     Outcome Measure Help from another person eating meals?: A Little Help from another person taking care of personal grooming?: None Help from another person toileting, which includes using toliet, bedpan, or urinal?: None Help from another person bathing (including washing, rinsing, drying)?: None Help from another person to put on and taking off regular upper body clothing?: None Help from another person to put on and taking off regular lower body clothing?: None 6 Click Score: 23   End of Session Equipment Utilized During Treatment: Gait belt  Activity Tolerance: Patient tolerated treatment well Patient left: in chair;with call bell/phone within reach;with chair alarm set  OT Visit Diagnosis: Feeding difficulties (R63.3);Pain Pain - Right/Left: (front of throat) Pain - part of body: (neck)                Time: 2924-4628 OT Time Calculation (min): 11 min Charges:     Jadene Pierini OTS  09/17/2018, 3:54 PM

## 2018-09-18 LAB — BASIC METABOLIC PANEL
Anion gap: 6 (ref 5–15)
BUN: 9 mg/dL (ref 6–20)
CALCIUM: 8.3 mg/dL — AB (ref 8.9–10.3)
CO2: 23 mmol/L (ref 22–32)
Chloride: 107 mmol/L (ref 98–111)
Creatinine, Ser: 0.56 mg/dL — ABNORMAL LOW (ref 0.61–1.24)
GFR calc Af Amer: >60 mL/min (ref >60)
GFR calc non Af Amer: >60 mL/min (ref >60)
Glucose, Bld: 127 mg/dL — ABNORMAL HIGH (ref 70–99)
Potassium: 3.8 mmol/L (ref 3.5–5.1)
Sodium: 136 mmol/L (ref 135–145)

## 2018-09-18 LAB — CBC
HCT: 41.6 % (ref 39.0–52.0)
Hemoglobin: 13.8 g/dL (ref 13.0–17.0)
MCH: 28.2 pg (ref 26.0–34.0)
MCHC: 33.2 g/dL (ref 30.0–36.0)
MCV: 85.1 fL (ref 80.0–100.0)
Platelets: 233 10*3/uL (ref 150–400)
RBC: 4.89 MIL/uL (ref 4.22–5.81)
RDW: 13.2 % (ref 11.5–15.5)
WBC: 9.8 10*3/uL (ref 4.0–10.5)
nRBC: 0 % (ref 0.0–0.2)

## 2018-09-18 LAB — GLUCOSE, CAPILLARY
Glucose-Capillary: 129 mg/dL — ABNORMAL HIGH (ref 70–99)
Glucose-Capillary: 105 mg/dL — ABNORMAL HIGH (ref 70–99)
Glucose-Capillary: 136 mg/dL — ABNORMAL HIGH (ref 70–99)
Glucose-Capillary: 89 mg/dL (ref 70–99)
Glucose-Capillary: 101 mg/dL — ABNORMAL HIGH (ref 70–99)

## 2018-09-18 MED ORDER — POLYETHYLENE GLYCOL 3350 17 G PO PACK: 17.0000 g | PACK | Freq: Every day | ORAL | Status: DC | PRN

## 2018-09-18 MED ORDER — DOCUSATE SODIUM 50 MG/5ML PO LIQD
50.0000 mg | Freq: Every day | ORAL | Status: DC | PRN
  Administered 2018-09-19: 22:00:00 50 mg
  Filled 2018-09-18 (×2): qty 10

## 2018-09-18 MED ORDER — OSMOLITE 1.5 CAL PO LIQD
237.0000 mL | Freq: Every day | ORAL | Status: DC
  Administered 2018-09-19 – 2018-09-20 (×4): 237 mL

## 2018-09-18 NOTE — Progress Notes (Signed)
[place] at [place] NAME: Jayanth Szczesniak    MR#:  [id number]  DATE OF BIRTH:  1966/03/05  SUBJECTIVE:    Pain around the trach site is better  Tolerating clear liquids  REVIEW OF SYSTEMS:   ROS-difficult to obtain due to trach  DRUG ALLERGIES:   Allergies  Allergen Reactions  . Tramadol Itching    VITALS:  Blood pressure (!) 142/73, pulse 77, temperature 98.3 F (36.8 C), temperature source Oral, resp. rate 20, height 6\' 1"  (1.854 m), weight 93.5 kg, SpO2 95 %.  PHYSICAL EXAMINATION:  GENERAL:  52 y.o.-year-old patient lying in the bed, in NAD EYES: Pupils equal, round, reactive to light . No scleral icterus. Extraocular muscles intact.  HEENT: Head atraumatic, normocephalic. Oropharynx and nasopharynx clear.  NECK:  Supple, no jugular venous distention. No thyroid enlargement, no tenderness. + Trach in place LUNGS: + Referred upper airway noises, no wheezing, rales,rhonchi or crepitation. No use of accessory muscles of respiration.  CARDIOVASCULAR: RRR, S1, S2 normal. No murmurs, rubs, or gallops.  ABDOMEN: Soft, nontender, nondistended. Bowel sounds present. No organomegaly or mass. + Gastrostomy tube in place without any surrounding erythema. EXTREMITIES: No pedal edema, cyanosis, or clubbing.  NEUROLOGIC: Alert, follows commands, answers questions with yes or no.  PSYCHIATRIC: Difficult to assess  SKIN: No obvious rash, lesion, or ulcer.   LABORATORY PANEL:   CBC Recent Labs  Lab 09/18/18 0510  WBC 9.8  HGB 13.8  HCT 41.6  PLT 233   ------------------------------------------------------------------------------------------------------------------  Chemistries  Recent Labs  Lab 09/14/18 0526  09/18/18 0510  NA 140   < > 136  K 3.5   < > 3.8  CL 107   < > 107  CO2 26   < > 23  GLUCOSE 144*   < > 127*  BUN 21*   < > 9  CREATININE 0.57*   < > 0.56*  CALCIUM 8.2*   < > 8.3*  MG 2.1  --   --    < > =  values in this interval not displayed.   ------------------------------------------------------------------------------------------------------------------  Cardiac Enzymes No results for input(s): TROPONINI in the last 168 hours. ------------------------------------------------------------------------------------------------------------------  RADIOLOGY:  Ir Gastrostomy Tube Mod Sed  Result Date: 09/16/2018 INDICATION: Laryngeal tumor EXAM: PERC PLACEMENT GASTROSTOMY MEDICATIONS: The patient is currently on IV antibiotics ANESTHESIA/SEDATION: Versed 3 mg IV; Fentanyl 150 mcg IV Moderate Sedation Time:  20 minutes The patient was continuously monitored during the procedure by the interventional radiology nurse under my direct supervision. CONTRAST:  73mL ISOVUE-300 IOPAMIDOL (ISOVUE-300) INJECTION 61% - administered into the gastric lumen. FLUOROSCOPY TIME:  Fluoroscopy Time: 3 minutes 6 seconds (124 mGy). COMPLICATIONS: None immediate. PROCEDURE: The procedure, risks, benefits, and alternatives were explained to the patient. Questions regarding the procedure were encouraged and answered. The patient understands and consents to the procedure. The epigastrium was prepped with Betadine in a sterile fashion, and a sterile drape was applied covering the operative field. A sterile gown and sterile gloves were used for the procedure. 1 mg glucagon was administered. The balloon was distended with gas via the NG tube. 1% lidocaine was utilized for local anesthesia. Under fluoroscopic guidance, an 18 gauge needle was advanced into the lumen of the stomach via the anterior wall. A T tack faster was then advanced through the needle. This was repeated and additional 2 times for 3 T tacks. This functioned as a pexy. Under fluoroscopic guidance, an 18 gauge needle was  advanced into the lumen of the stomach. It was removed over an Amplatz wire. The tract was dilated to 20 [place]. A 20 French peel-away sheath was  inserted over the wire into the stomach. An 72 French balloon retention gastrostomy tube was then advanced through the peel-away sheath. The peel-away sheath was removed. 10 cc was utilized to insufflate the balloon. Contrast was then injected into the gastrostomy tube. FINDINGS: Images demonstrate placement of an 76 French balloon retention gastrostomy tube into the lumen of the stomach. Contrast fills the lumen of the stomach. IMPRESSION: Successful placement of an 39 French balloon retention gastrostomy tube. Electronically Signed   By: Marybelle Killings M.D.   On: 09/16/2018 14:57    EKG:   Orders placed or performed during the hospital encounter of 09/10/18  . EKG 12-Lead  . 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  CT head/chest/abdomen/pelvis negative for mets.   Gastrostomy tube placed 12/12.  Tolerating tube feeds -Continue trach collar -ENT and oncology following  -SLP following -Continue trach teaching -[facility] to see patient mid next week. Needs PET scan as outpatient per oncology -Follow-up with ENT on 09/20/2018  * Aspiration pneumonia- chest x-ray with left basilar opacity. Repeat CXR negative. -Transition from Unasyn to Amoxicillin -Duonebs as needed  * Hyperglycemia-blood sugars mildly elevated, no diagnosis of diabetes. A1c 5.5% this admission -Continue sliding scale insulin  * Generalized weakness- likely due to deconditioning -PT/OT consult placed  All the records are reviewed and case discussed with Care Management/Social Worker Management plans discussed with the patient, family and they are in agreement.  CODE STATUS: Full code  TOTAL TIME TAKING CARE OF THIS PATIENT: 35 minutes.   POSSIBLE D/C IN 1-2 DAYS, DEPENDING ON CLINICAL CONDITION.  Leia Alf Alissia Lory M.D on 09/18/2018 at 1:35 PM  Between 7am to 6pm - Pager - [phone]  After 6pm go to  [email or web address] - password [email or web address]  [facility]  Office  [phone]  CC: Primary care physician; System, Provider Not In  Note: This dictation was prepared with Dragon dictation along with smaller phrase technology. Any transcriptional errors that result from this process are unintentional.

## 2018-09-18 NOTE — Progress Notes (Signed)
Upon entering the pt's room; pt currently on a clear liquid diet and has bolus tube feedings of Osmolite 1.5 cal four times a day; pt had a bag of Fritos in the window seal upon assessment at [phone]; the Frito bag was empty on his bedside table; the pt was questioned whether or not he ate the [place], he acknowledged that he had by nodding his head; he wrote on his paper that he was tired of liquids and wanted his diet advanced; relayed the information to Dr Darvin Neighbours who gave verbal orders for SLP eval; spoke with Curt Bears in Speech who advised that she could not do a bedside swallow eval that he would have to have a modified swallow study in radiology on Monday, 09/20/18 due to no in house radiologist on the weekend, Dr Darvin Neighbours aware; pt's possible discharge today has been delayed to possibly on Monday; pt aware of situation and he is not happy

## 2018-09-19 LAB — GLUCOSE, CAPILLARY
Glucose-Capillary: 74 mg/dL (ref 70–99)
Glucose-Capillary: 80 mg/dL (ref 70–99)
Glucose-Capillary: 91 mg/dL (ref 70–99)
Glucose-Capillary: 92 mg/dL (ref 70–99)

## 2018-09-19 LAB — BASIC METABOLIC PANEL
Anion gap: 7 (ref 5–15)
BUN: 8 mg/dL (ref 6–20)
CHLORIDE: 102 mmol/L (ref 98–111)
CO2: 26 mmol/L (ref 22–32)
Calcium: 8.3 mg/dL — ABNORMAL LOW (ref 8.9–10.3)
Creatinine, Ser: 0.66 mg/dL (ref 0.61–1.24)
GFR calc Af Amer: >60 mL/min (ref >60)
GFR calc non Af Amer: >60 mL/min (ref >60)
Glucose, Bld: 159 mg/dL — ABNORMAL HIGH (ref 70–99)
POTASSIUM: 3.6 mmol/L (ref 3.5–5.1)
Sodium: 135 mmol/L (ref 135–145)

## 2018-09-19 LAB — MAGNESIUM: Magnesium: 1.9 mg/dL (ref 1.7–2.4)

## 2018-09-19 LAB — PHOSPHORUS: Phosphorus: 3.4 mg/dL (ref 2.5–4.6)

## 2018-09-19 LAB — TRIGLYCERIDES: Triglycerides: 86 mg/dL (ref <150)

## 2018-09-19 NOTE — Progress Notes (Signed)
Suctioning declined. Pt is expectorating sputum

## 2018-09-19 NOTE — Progress Notes (Signed)
[place] at [place] NAME: Teron Blais    MR#:  [id number]  DATE OF BIRTH:  [birth year]  SUBJECTIVE:    Pain around the trach site is improving Tolerating clear liquids  Leaving the floor and going out to smoke  REVIEW OF SYSTEMS:   Review of Systems  Constitutional: Negative for chills and fever.  HENT: Negative for sore throat.   Eyes: Negative for blurred vision, double vision and pain.  Respiratory: Negative for cough, hemoptysis, shortness of breath and wheezing.   Cardiovascular: Negative for chest pain, palpitations, orthopnea and leg swelling.  Gastrointestinal: Negative for abdominal pain, constipation, diarrhea, heartburn, nausea and vomiting.  Genitourinary: Negative for dysuria and hematuria.  Musculoskeletal: Negative for back pain and joint pain.  Skin: Negative for rash.  Neurological: Negative for sensory change, speech change, focal weakness and headaches.  Endo/Heme/Allergies: Does not bruise/bleed easily.  Psychiatric/Behavioral: Negative for depression. The patient is not nervous/anxious.    Cough. DRUG ALLERGIES:   Allergies  Allergen Reactions  . Tramadol Itching    VITALS:  Blood pressure 134/78, pulse 65, temperature 98.2 F (36.8 C), temperature source Oral, resp. rate (!) 24, height 6\' 1"  (1.854 m), weight 92.3 kg, SpO2 98 %.  PHYSICAL EXAMINATION:  GENERAL:  52 y.o.-year-old patient lying in the bed, in NAD EYES: Pupils equal, round, reactive to light . No scleral icterus. Extraocular muscles intact.  HEENT: Head atraumatic, normocephalic. Oropharynx and nasopharynx clear.  NECK:  Supple, no jugular venous distention. No thyroid enlargement, no tenderness. + Trach in place LUNGS: + Referred upper airway noises, no wheezing, rales,rhonchi or crepitation. No use of accessory muscles of respiration.  CARDIOVASCULAR: RRR, S1, S2 normal. No murmurs, rubs, or gallops.  ABDOMEN: Soft, nontender,  nondistended. Bowel sounds present. No organomegaly or mass. + Gastrostomy tube in place without any surrounding erythema. EXTREMITIES: No pedal edema, cyanosis, or clubbing.  NEUROLOGIC: Alert, follows commands, answers questions with yes or no.  PSYCHIATRIC: Difficult to assess  SKIN: No obvious rash, lesion, or ulcer.   LABORATORY PANEL:   CBC Recent Labs  Lab 09/18/18 0510  WBC 9.8  HGB 13.8  HCT 41.6  PLT 233   ------------------------------------------------------------------------------------------------------------------  Chemistries  Recent Labs  Lab 09/14/18 0526  09/18/18 0510  NA 140   < > 136  K 3.5   < > 3.8  CL 107   < > 107  CO2 26   < > 23  GLUCOSE 144*   < > 127*  BUN 21*   < > 9  CREATININE 0.57*   < > 0.56*  CALCIUM 8.2*   < > 8.3*  MG 2.1  --   --    < > = values in this interval not displayed.   ------------------------------------------------------------------------------------------------------------------  Cardiac Enzymes No results for input(s): TROPONINI in the last 168 hours. ------------------------------------------------------------------------------------------------------------------  RADIOLOGY:  No results found.  EKG:   Orders placed or performed during the hospital encounter of 09/10/18  . EKG 12-Lead  . 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  CT head/chest/abdomen/pelvis negative for mets.   Gastrostomy tube placed 12/12.  Tolerating tube feeds -Continue trach collar -ENT and oncology following  -SLP following -Trach teaching -[facility] to see patient mid next week. Needs PET scan as outpatient per oncology -Follow-up with ENT on 09/20/2018  * Aspiration pneumonia- chest  x-ray with left basilar opacity. Repeat CXR negative. -Transition from Unasyn to Amoxicillin -Duonebs as needed  * Hyperglycemia-blood  sugars mildly elevated, no diagnosis of diabetes. A1c 5.5% this admission -Continue sliding scale insulin  * Generalized weakness- likely due to deconditioning -PT/OT consult placed  His supplies for trach care and tube feedings will be set up tomorrow.  Also modified barium swallow tomorrow. Likely discharge tomorrow  All the records are reviewed and case discussed with Care Management/Social Worker Management plans discussed with the patient, family and they are in agreement.  CODE STATUS: Full code  TOTAL TIME TAKING CARE OF THIS PATIENT: 35 minutes.   POSSIBLE D/C IN 1-2 DAYS, DEPENDING ON CLINICAL CONDITION.  Leia Alf Airica Schwartzkopf M.D on 09/19/2018 at 11:47 AM  Between 7am to 6pm - Pager - [phone]  After 6pm go to [email or web address] - password [email or web address]  [facility]  Office  [phone]  CC: Primary care physician; System, Provider Not In  Note: This dictation was prepared with Dragon dictation along with smaller phrase technology. Any transcriptional errors that result from this process are unintentional.

## 2018-09-19 NOTE — Progress Notes (Signed)
The patient left the floor for a walk and was witnessed by a staff member getting into a white truck and smoking a cigarette. Pt was located and brought back to room, educated on the importance of not smoking and not leaving the floor.

## 2018-09-19 NOTE — Progress Notes (Signed)
Patient's trache was suctioned. There was a moderate amount of tan secretions.

## 2018-09-19 NOTE — Consult Note (Signed)
PHARMACY CONSULT NOTE - FOLLOW UP  Pharmacy Consult for Electrolyte Monitoring and Replacement   Recent Labs: Potassium (mmol/L)  Date Value  09/19/2018 3.6   Magnesium (mg/dL)  Date Value  09/19/2018 1.9   Calcium (mg/dL)  Date Value  09/19/2018 8.3 (L)   Albumin (g/dL)  Date Value  09/12/2018 3.2 (L)   Phosphorus (mg/dL)  Date Value  09/19/2018 3.4    Assessment: Pharmacy consulted for electrolyte monitoring and replacement in 52 yo male admitted with upper airway obstruction. Patient had Gastrostomy tube placed 12/12.   Goal of Therapy:  Electrolytes WNL  Plan:  12/15 No replacement needed at this time.  Will recheck electrolytes with AM labs.  Pharmacy will continue to follow and replace electrolytes as needed.   Alan Chung, PharmD, BCPS Clinical Pharmacist 09/19/2018 4:08 PM

## 2018-09-20 ENCOUNTER — Inpatient Hospital Stay: Payer: Medicaid Other

## 2018-09-20 LAB — BASIC METABOLIC PANEL
Anion gap: 7 (ref 5–15)
BUN: 8 mg/dL (ref 6–20)
CO2: 26 mmol/L (ref 22–32)
Calcium: 8.3 mg/dL — ABNORMAL LOW (ref 8.9–10.3)
Chloride: 101 mmol/L (ref 98–111)
Creatinine, Ser: 0.55 mg/dL — ABNORMAL LOW (ref 0.61–1.24)
GFR calc Af Amer: >60 mL/min (ref >60)
GFR calc non Af Amer: >60 mL/min (ref >60)
Glucose, Bld: 136 mg/dL — ABNORMAL HIGH (ref 70–99)
POTASSIUM: 3.4 mmol/L — AB (ref 3.5–5.1)
Sodium: 134 mmol/L — ABNORMAL LOW (ref 135–145)

## 2018-09-20 LAB — GLUCOSE, CAPILLARY
GLUCOSE-CAPILLARY: 186 mg/dL — AB (ref 70–99)
Glucose-Capillary: 123 mg/dL — ABNORMAL HIGH (ref 70–99)
Glucose-Capillary: 117 mg/dL — ABNORMAL HIGH (ref 70–99)
Glucose-Capillary: 89 mg/dL (ref 70–99)
Glucose-Capillary: 199 mg/dL — ABNORMAL HIGH (ref 70–99)
Glucose-Capillary: 100 mg/dL — ABNORMAL HIGH (ref 70–99)

## 2018-09-20 LAB — MAGNESIUM: Magnesium: 1.9 mg/dL (ref 1.7–2.4)

## 2018-09-20 LAB — PHOSPHORUS: Phosphorus: 4 mg/dL (ref 2.5–4.6)

## 2018-09-20 MED ORDER — POTASSIUM CHLORIDE 20 MEQ PO PACK
40.0000 meq | PACK | Freq: Once | ORAL | Status: AC
  Administered 2018-09-20: 40 meq via ORAL
  Filled 2018-09-20: qty 2

## 2018-09-20 MED ORDER — OXYCODONE HCL 5 MG PO TABS
10.0000 mg | ORAL_TABLET | ORAL | Status: DC | PRN
  Administered 2018-09-20: 14:00:00 10 mg via ORAL
  Filled 2018-09-20: qty 2

## 2018-09-20 MED ORDER — OXYCODONE HCL 10 MG PO TABS: 10.0000 mg | ORAL_TABLET | Freq: Four times a day (QID) | ORAL | 0 refills | Status: AC | PRN

## 2018-09-20 NOTE — Discharge Summary (Signed)
[place] [facility] [place] NAME: Alan Chung    MR#:  [id number]  DATE OF BIRTH:  March 19, 1966  DATE OF ADMISSION:  09/10/2018 ADMITTING PHYSICIAN: Carloyn Manner, MD  DATE OF DISCHARGE: 09/20/2018  PRIMARY CARE PHYSICIAN: System, Provider Not In   ADMISSION DIAGNOSIS:  Neck mass [R22.1]  DISCHARGE DIAGNOSIS:  Active Problems:   Tracheostomy, acute management ([place])   Acute respiratory failure ([place])   Laryngeal mass   Malnutrition of moderate degree   Feeding by G-tube ([place])   Laryngeal cancer ([place])   SECONDARY DIAGNOSIS:   Past Medical History:  Diagnosis Date  . Hypertension      ADMITTING HISTORY  HISTORY OF PRESENT ILLNESS:  Alan Chung  is a 52 y.o. male with a known history of essential hypertension, tobacco abuse disorder has been having worsening of shortness of breath for the past 1 month associated with inability to swallow his secretions.  Patient came into the ED on 1118 and was treated for COPD exacerbation with steroids, came into ED again to refill his steroids, ED physician was concerned as patient was short of breath with stridor.  CT scan of the soft tissue of the neck was ordered and patient became bradycardic.  ENT consult placed emergently for acute hypoxic respiratory failure with supraglottic mass, seen by Dr.Vaught and patient had emergent tracheostomy and placed on ventilator.  No family members at bedside during my examination patient was seen in intensive care unit ,according to the ICU nurse brother just left after talking to the intensivist   HOSPITAL COURSE:   * Acute upper airway obstruction s/p emergency tracheostomy by ENT due to squamous cell carcinoma of the supraglottis.  CT head/chest/abdomen/pelvis negative for mets.   Gastrostomy tube placed 12/12.  Tolerating tube feeds.  Should be on mechanical soft diet with thin liquids.  Aspiration precautions. Patient noncompliant with instructions.  High  risk for aspiration pneumonia, respiratory distress.  Consult.  -Continue trach collar -Trach teaching done in the hospital -[facility] follow-up appointment tomorrow. Needs PET scan as outpatient per oncology  * Aspiration pneumonia- chest x-ray with left basilar opacity. Repeat CXR negative. -Transition from Unasyn to Amoxicillin.  Finished amoxicillin in the hospital -Duonebs as needed  * Hyperglycemia-blood sugars mildly elevated, no diagnosis of diabetes. A1c 5.5% this admission -sliding scale insulin in the hospital  * Generalized weakness- likely due to deconditioning Much improved.  Patient ambulating independently.  Patient stable to be discharged back home.  Home health to deliver supplies.  Noncompliant.  Continues to smoke and not follow instructions regarding aspiration precautions.  High risk for deterioration.  CONSULTS OBTAINED:  Treatment Team:  Hermelinda Dellen, DO Cammie Sickle, MD  DRUG ALLERGIES:   Allergies  Allergen Reactions  . Tramadol Itching    DISCHARGE MEDICATIONS:   Allergies as of 09/20/2018      Reactions   Tramadol Itching      Medication List    STOP taking these medications   predniSONE 50 MG tablet Commonly known as:  DELTASONE     TAKE these medications   albuterol 108 (90 Base) MCG/ACT inhaler Commonly known as:  PROVENTIL HFA;VENTOLIN HFA Inhale 2 puffs into the lungs every 6 (six) hours as needed for wheezing or shortness of breath.   ibuprofen 800 MG tablet Commonly known as:  ADVIL,MOTRIN Take 800 mg by mouth every 8 (eight) hours as needed.   Oxycodone HCl 10 MG Tabs Take 1 tablet (10 mg total) by  mouth every 6 (six) hours as needed for severe pain.            Durable Medical Equipment  (From admission, onward)         Start     Ordered   09/20/18 1241  For home use only DME Suction  Once    Comments:  Suction machine, suction tubing, suction catheters, Size 8 shiley uncuffed trach tube, trach tube  holders, passey muir valve  Question:  Suction  Answer:  Lurline Idol   09/20/18 1243          Today   VITAL SIGNS:  Blood pressure 137/79, pulse 64, temperature 98.4 F (36.9 C), temperature source Oral, resp. rate 18, height 6\' 1"  (1.854 m), weight 92.7 kg, SpO2 97 %.  I/O:    Intake/Output Summary (Last 24 hours) at 09/20/2018 1337 Last data filed at 09/20/2018 0937 Gross per 24 hour  Intake 360 ml  Output -  Net 360 ml    PHYSICAL EXAMINATION:  Physical Exam  GENERAL:  52 y.o.-year-old patient lying in the bed with no acute distress.  Trach in place LUNGS: Normal breath sounds bilaterally, no wheezing, rales,rhonchi or crepitation. No use of accessory muscles of respiration.  CARDIOVASCULAR: S1, S2 normal. No murmurs, rubs, or gallops.  ABDOMEN: Soft, non-tender, non-distended. Bowel sounds present. No organomegaly or mass.  NEUROLOGIC: Moves all 4 extremities. PSYCHIATRIC: The patient is alert and oriented x 3.  SKIN: No obvious rash, lesion, or ulcer.   DATA REVIEW:   CBC Recent Labs  Lab 09/18/18 0510  WBC 9.8  HGB 13.8  HCT 41.6  PLT 233    Chemistries  Recent Labs  Lab 09/20/18 0509  NA 134*  K 3.4*  CL 101  CO2 26  GLUCOSE 136*  BUN 8  CREATININE 0.55*  CALCIUM 8.3*  MG 1.9    Cardiac Enzymes No results for input(s): TROPONINI in the last 168 hours.  Microbiology Results  Results for orders placed or performed during the hospital encounter of 09/10/18  MRSA PCR Screening     Status: None   Collection Time: 09/10/18  3:22 PM  Result Value Ref Range Status   MRSA by PCR NEGATIVE NEGATIVE Final    Comment:        The GeneXpert MRSA Assay (FDA approved for NASAL specimens only), is one component of a comprehensive MRSA colonization surveillance program. It is not intended to diagnose MRSA infection nor to guide or monitor treatment for MRSA infections. Performed at [facility], [address] [place], [place] [address]    Culture, respiratory (non-expectorated)     Status: None   Collection Time: 09/12/18 10:50 PM  Result Value Ref Range Status   Specimen Description   Final    TRACHEAL ASPIRATE Performed at [facility], [address], [place], [place] [address]    Special Requests   Final    NONE Performed at [facility], [address] [place], [place] [address]    Gram Stain   Final    RARE WBC PRESENT, PREDOMINANTLY PMN MODERATE GRAM POSITIVE COCCI IN PAIRS FEW GRAM NEGATIVE RODS    Culture   Final    ABUNDANT STREPTOCOCCUS PNEUMONIAE ABUNDANT HAEMOPHILUS INFLUENZAE BETA LACTAMASE NEGATIVE Performed at [facility], [place] [address], [place], [place] [address]    Report Status 09/15/2018 FINAL  Final   Organism ID, Bacteria STREPTOCOCCUS PNEUMONIAE  Final      Susceptibility   Streptococcus pneumoniae -  MIC*    ERYTHROMYCIN 1 RESISTANT Resistant     LEVOFLOXACIN 0.5 SENSITIVE Sensitive     VANCOMYCIN 0.5 SENSITIVE Sensitive     PENICILLIN (meningitis) <=0.06 SENSITIVE Sensitive     PENICILLIN (non-meningitis) <=0.06 SENSITIVE Sensitive     CEFTRIAXONE (non-meningitis) <=0.12 SENSITIVE Sensitive     CEFTRIAXONE (meningitis) <=0.12 SENSITIVE Sensitive     * ABUNDANT STREPTOCOCCUS PNEUMONIAE    RADIOLOGY:  No results found.  Follow up with PCP in 1 week.  Management plans discussed with the patient, family and they are in agreement.  CODE STATUS:     Code Status Orders  (From admission, onward)         Start     Ordered   09/10/18 1608  Full code  Continuous     09/10/18 1608        Code Status History    This patient has a current code status but no historical code status.      TOTAL TIME TAKING CARE OF THIS PATIENT ON DAY OF DISCHARGE: more than 30 minutes.   Leia Alf Fermon Ureta M.D on 09/20/2018 at 1:37 PM  Between 7am to 6pm - Pager - [phone]  After 6pm go to [email or web address] - password [email or web address] [facility]   Office  [phone]  CC: Primary care physician; System, Provider Not In  Note: This dictation was prepared with Dragon dictation along with smaller phrase technology. Any transcriptional errors that result from this process are unintentional.

## 2018-09-20 NOTE — Progress Notes (Signed)
.. 09/20/2018 8:04 AM  Alan Chung [id number]  Post-Op Day 10    Temp:  [98.4 F (36.9 C)-98.5 F (36.9 C)] 98.4 F (36.9 C) (12/16 0400) Pulse Rate:  [64-85] 64 (12/16 0400) Resp:  [18-20] 18 (12/16 0400) BP: (136-140)/(73-79) 137/79 (12/16 0400) SpO2:  [94 %-100 %] 95 % (12/16 0400) FiO2 (%):  [28 %] 28 % (12/15 2026) Weight:  [92.7 kg] 92.7 kg (12/16 0500),    No intake or output data in the 24 hours ending 09/20/18 0804  Results for orders placed or performed during the hospital encounter of 09/10/18 (from the past 24 hour(s))  Glucose, capillary     Status: None   Collection Time: 09/19/18 11:39 AM  Result Value Ref Range   Glucose-Capillary 80 70 - 99 mg/dL   Comment 1 Notify RN   Triglycerides     Status: None   Collection Time: 09/19/18  1:30 PM  Result Value Ref Range   Triglycerides 86 <150 mg/dL  Phosphorus     Status: None   Collection Time: 09/19/18  1:30 PM  Result Value Ref Range   Phosphorus 3.4 2.5 - 4.6 mg/dL  Magnesium     Status: None   Collection Time: 09/19/18  1:30 PM  Result Value Ref Range   Magnesium 1.9 1.7 - 2.4 mg/dL  Basic metabolic panel     Status: Abnormal   Collection Time: 09/19/18  1:30 PM  Result Value Ref Range   Sodium 135 135 - 145 mmol/L   Potassium 3.6 3.5 - 5.1 mmol/L   Chloride 102 98 - 111 mmol/L   CO2 26 22 - 32 mmol/L   Glucose, Bld 159 (H) 70 - 99 mg/dL   BUN 8 6 - 20 mg/dL   Creatinine, Ser 0.66 0.61 - 1.24 mg/dL   Calcium 8.3 (L) 8.9 - 10.3 mg/dL   GFR calc non Af Amer >60 >60 mL/min   GFR calc Af Amer >60 >60 mL/min   Anion gap 7 5 - 15  Glucose, capillary     Status: Abnormal   Collection Time: 09/19/18  4:51 PM  Result Value Ref Range   Glucose-Capillary 186 (H) 70 - 99 mg/dL   Comment 1 Notify RN   Glucose, capillary     Status: Abnormal   Collection Time: 09/19/18  8:28 PM  Result Value Ref Range   Glucose-Capillary 123 (H) 70 - 99 mg/dL  Glucose, capillary     Status: Abnormal   Collection  Time: 09/20/18  1:02 AM  Result Value Ref Range   Glucose-Capillary 117 (H) 70 - 99 mg/dL   Comment 1 Notify RN    Comment 2 Document in Chart   Glucose, capillary     Status: None   Collection Time: 09/20/18  3:58 AM  Result Value Ref Range   Glucose-Capillary 89 70 - 99 mg/dL  Phosphorus     Status: None   Collection Time: 09/20/18  5:09 AM  Result Value Ref Range   Phosphorus 4.0 2.5 - 4.6 mg/dL  Magnesium     Status: None   Collection Time: 09/20/18  5:09 AM  Result Value Ref Range   Magnesium 1.9 1.7 - 2.4 mg/dL  Basic metabolic panel     Status: Abnormal   Collection Time: 09/20/18  5:09 AM  Result Value Ref Range   Sodium 134 (L) 135 - 145 mmol/L   Potassium 3.4 (L) 3.5 - 5.1 mmol/L   Chloride 101 98 - 111 mmol/L  CO2 26 22 - 32 mmol/L   Glucose, Bld 136 (H) 70 - 99 mg/dL   BUN 8 6 - 20 mg/dL   Creatinine, Ser 0.55 (L) 0.61 - 1.24 mg/dL   Calcium 8.3 (L) 8.9 - 10.3 mg/dL   GFR calc non Af Amer >60 >60 mL/min   GFR calc Af Amer >60 >60 mL/min   Anion gap 7 5 - 15  Glucose, capillary     Status: Abnormal   Collection Time: 09/20/18  7:38 AM  Result Value Ref Range   Glucose-Capillary 199 (H) 70 - 99 mg/dL    SUBJECTIVE:  No acute events.  Watched over weekend for swallowing evaluation  OBJECTIVE:  GEN-  NAD NECK-  Size 8 uncuffed shiley in place and patent  IMPRESSION:  S/p tracheostomy/gastrostomy tube for advanced stage SCCA of supraglottis  PLAN:  Will have patient follow up with [facility] DR. Ihor Austin later this week for surgical planning.  Continue routine trach care.  No need to see patient in clinic prior to [facility] appointment as doing well.  Alan Chung 09/20/2018, 8:04 AM

## 2018-09-20 NOTE — Progress Notes (Signed)
Went over trache care with patient. Answered any questions he has concerning his trache care. Patient demonstrated trache care.

## 2018-09-20 NOTE — Care Management (Addendum)
Scheduled for a modified barium swallow today. Possible discharge to home later today per Dr, Darvin Neighbours. All home equipment has been and will be delivered per [place].  Tolerating diet very well.  Will be followed by [facility] for chrarity  care.  Will be followed at [facility] scheduled for 09/21/18, 9:20 am.   Will fax discharge summary to [phone]. Shelbie Ammons RN MSN CCM Care Management [phone]

## 2018-09-20 NOTE — Evaluation (Signed)
Objective Swallowing Evaluation: Type of Study: MBS-Modified Barium Swallow Study   Patient Details  Name: Alan Chung MRN: [id number] Date of Birth: 24-Jun-1966  Today's Date: 09/20/2018 Time: SLP Start Time (ACUTE ONLY): 1140 -SLP Stop Time (ACUTE ONLY): 1215  SLP Time Calculation (min) (ACUTE ONLY): 35 min   Past Medical History:  Past Medical History:  Diagnosis Date  . Hypertension    Past Surgical History:  Past Surgical History:  Procedure Laterality Date  . DIAGNOSTIC LARYNGOSCOPY  09/10/2018   Procedure: DIAGNOSTIC LARYNGOSCOPY WITH BIOPSY;  Surgeon: Carloyn Manner, MD;  Location: [facility] ORS;  Service: ENT;;  . IR GASTROSTOMY TUBE MOD SED  09/16/2018  . right ankle surgery    . TRACHEOSTOMY TUBE PLACEMENT N/A 09/10/2018   Procedure: TRACHEOSTOMY;  Surgeon: Carloyn Manner, MD;  Location: [facility] ORS;  Service: ENT;  Laterality: N/A;   HPI: Per admitting H&P: Alan Chung  is a 52 y.o. male with a known history of essential hypertension, tobacco abuse disorder has been having worsening of shortness of breath for the past 1 month associated with inability to swallow his secretions.  Patient came into the ED on 1118 and was treated for COPD exacerbation with steroids, came into ED again to refill his steroids, ED physician was concerned as patient was short of breath with stridor.  CT scan of the soft tissue of the neck was ordered and patient became bradycardic.  ENT consult placed emergently for acute hypoxic respiratory failure with supraglottic mass, seen by Dr.Vaught and patient had emergent tracheostomy and placed on ventilator.  No family members at bedside during my examination patient was seen in intensive care unit ,according to the ICU nurse brother just left after talking to the intensivist   Subjective: Pt was alert and cooperative during evaluation.    Assessment / Plan / Recommendation  CHL IP CLINICAL IMPRESSIONS 09/20/2018  Clinical Impression This  52 y/o  male s/p trachostomy placement due to respiratory failure with supraglottic mass presents with mod pharyngeal dysphagia. Oral phase WFL. Adequate oral prep/coordination, A-P transit time. Pharyngeal deficits are largely due to changes in anatomy (supraglottic mass) and influence of trach placement.  Pharyngeal phase was characterized by reduced laryngeal elevation and limited epiglottic movement resulting in poor laryngeal closure. Observed decreased pharyngeal pressure, limited base of tongue retraction and pharyngeal strip due to same. Pt currently unable to tolerate PMV placement to improve pharyngeal pressure and improve safety of swallow due to size of supraglottic mass.  Poor base of tongue retraction and decreased pharyngeal pressure caused Mild residue build up in the vallecula  and the surrounding soft tissues of the supraglottic mass. Limited epiglottic movement and poor laryngeal closure caused multiple instances of layngeal penetration with sips of nectar and thin liquids, as well as silent aspiration of vallecular residue after initial swallow. Patient able to largely clear the small amounts of aspiration observed with verbal cues for cough. No aspiration/penetration was observed for TSP trials across textures. Cues for dry swallow and strong, volitional cough consistently aided clearance of penetrated material and reduced vallecular residue. Patient shows clear s/s of aspiration and penetration is at risk for aspiration pneumonia due to decreased airway protection during intake. Recommend Mech soft diet and thin liquids with the following strategies to mitigate aspiration risk: liquids by TSP ONLY,  Swallow x2 after each tsp, Cough after every 2-3 tsp's,and aggressive oral care to decrease oral bacteria. Patient would benefit from ST skilled intervention at the next level of care at least 3x/week  to address swallowing deficits. ST provided education of safe swallow strategies to patient as he is  schedule to discharge today.  SLP Visit Diagnosis Dysphagia, pharyngeal phase (R13.13)  Attention and concentration deficit following --  Frontal lobe and executive function deficit following --  Impact on safety and function Moderate aspiration risk      CHL IP TREATMENT RECOMMENDATION 09/20/2018  Treatment Recommendations Therapy as outlined in treatment plan below     Prognosis 09/20/2018  Prognosis for Safe Diet Advancement Good  Barriers to Reach Goals Severity of deficits  Barriers/Prognosis Comment --    CHL IP DIET RECOMMENDATION 09/20/2018  SLP Diet Recommendations Dysphagia 3 (Mech soft) solids;Thin liquid  Liquid Administration via Spoon  Medication Administration Whole meds with puree  Compensations Minimize environmental distractions;Slow rate;Small sips/bites;Multiple dry swallows after each bite/sip;Hard cough after swallow  Postural Changes Remain semi-upright after after feeds/meals (Comment);Seated upright at 90 degrees      CHL IP OTHER RECOMMENDATIONS 09/20/2018  Recommended Consults --  Oral Care Recommendations Oral care QID  Other Recommendations --      CHL IP FOLLOW UP RECOMMENDATIONS 09/20/2018  Follow up Recommendations Other (comment)      CHL IP FREQUENCY AND DURATION 09/20/2018  Speech Therapy Frequency (ACUTE ONLY) min 3x week  Treatment Duration 1 week           CHL IP ORAL PHASE 09/20/2018  Oral Phase WFL  Oral - Pudding Teaspoon --  Oral - Pudding Cup --  Oral - Honey Teaspoon --  Oral - Honey Cup --  Oral - Nectar Teaspoon --  Oral - Nectar Cup --  Oral - Nectar Straw --  Oral - Thin Teaspoon --  Oral - Thin Cup --  Oral - Thin Straw --  Oral - Puree --  Oral - Mech Soft --  Oral - Regular --  Oral - Multi-Consistency --  Oral - Pill --  Oral Phase - Comment --    CHL IP PHARYNGEAL PHASE 09/20/2018  Pharyngeal Phase Impaired  Pharyngeal- Pudding Teaspoon --  Pharyngeal --  Pharyngeal- Pudding Cup --  Pharyngeal --   Pharyngeal- Honey Teaspoon --  Pharyngeal --  Pharyngeal- Honey Cup --  Pharyngeal --  Pharyngeal- Nectar Teaspoon --2  Pharyngeal --  Pharyngeal- Nectar Cup --3  Pharyngeal --  Pharyngeal- Nectar Straw --  Pharyngeal --  Pharyngeal- Thin Teaspoon --2  Pharyngeal --  Pharyngeal- Thin Cup --4  Pharyngeal --  Pharyngeal- Thin Straw --  Pharyngeal --  Pharyngeal- Puree --2  Pharyngeal --  Pharyngeal- Mechanical Soft --  Pharyngeal --  Pharyngeal- Regular --2  Pharyngeal --  Pharyngeal- Multi-consistency --  Pharyngeal --  Pharyngeal- Pill --  Pharyngeal --  Pharyngeal Comment --     CHL IP CERVICAL ESOPHAGEAL PHASE 09/20/2018  Cervical Esophageal Phase WFL  Pudding Teaspoon --  Pudding Cup --  Honey Teaspoon --  Honey Cup --  Nectar Teaspoon --  Nectar Cup --  Nectar Straw --  Thin Teaspoon --  Thin Cup --  Thin Straw --  Puree --  Mechanical Soft --  Regular --  Multi-consistency --  Pill --  Cervical Esophageal Comment --     Ryane Konieczny, MA, CCC-SLP 09/20/2018, 12:51 PM

## 2018-09-20 NOTE — Progress Notes (Signed)
Pt is discharged with home health service. Reviewed AVS with patient and family. Confirmed apts with family and brother. Patient and family verbalized understanding. Paper rx giving to patient

## 2018-09-20 NOTE — Discharge Instructions (Addendum)
How to Clean a Tracheostomy and Replace Tracheostomy Ties, Adult A tracheostomy is a surgically created opening in the trachea that is made in the front of the neck. It is important to keep a tracheostomy clean. Doing this helps you:  Keep your skin healthy.  Reduce your risk of infection.  Keep your airway secure.  Make sure to follow any specific instructions from the person's health care provider when you clean a tracheostomy and replace tracheostomy ties or a holder. Tips  If available, have a second person helping you (helper).  If tracheostomy ties are used, it is important to replace them regularly to keep them from getting damaged or dirty. How to get ready 1. Have all supplies ready and available. These include: ? Clean gloves. ? Twill tape (ties) or a manufactured tracheostomy holder. ? Scissors. ? Container to hold liquid. ? Sterile water, tap water, or 0.9% saline solution. ? Rolled-up blanket or towel. ? Sponge. ? Cotton swabs. ? 4x4 inch (10x10 cm) gauze pads. ? Pre-cut pads or gauze for under the faceplate. ? Clamp or tweezers. 2. Wash your hands, and have your helper wash his or her hands. 3. Put on clean gloves, and have your helper put on clean gloves. 4. If using tracheostomy ties: ? Measure a length of twill tie that can go around the neck twice. ? Cut the end of the tie on a diagonal to a point. This will make it easier to thread through the opening of the faceplate. 5. If using a tracheostomy holder, open the packaging. How to clean the tracheostomy 1. Fill a container with the water or 0.9% saline solution. 2. Have the person tip his or her head back a little. This will make it easier to see and clean the opening in his or her neck. 3. Place a rolled-up blanket or towel under or behind the person's shoulders. 4. If there are any pads under the faceplate, remove them. 5. Wet the sponge and cotton swabs with the water or 0.9% saline solution. 6. Clean  the tracheostomy site, surrounding skin, and neck. 7. Allow the skin to dry. You may pat it dry with a dry 4x4 inch (10x10 cm) gauze. 8. Replace the tracheostomy pads with a pre-cut pad or gauze. How to replace the ties or holder If Tracheostomy Ties Are Used Follow these steps to replace them: 1. While you hold the tracheostomy tube--also called a trach (rhymes with take)--have your helper cut or loosen the ties that are in place. Make sure the tube (trach) stays in place until the new ties are secured. (If working alone, do not remove the old ties until the new ties are secured.) 2. Use a clamp or tweezers, if needed, to thread one end of the new tie through the faceplate or trach flange. Pull through until the ends are even. 3. Bring ties around the back of the neck, then thread one tie through the opposite hole in the faceplate. 4. Gently pull the ties to secure the trach. 5. Secure the tie with a double square knot. For comfort, make sure you can fit 1-2 finger widths between the neck and the tie.  If a Tracheostomy Holder Is Used Have one person hold the trach while the other person changes the holder. Make sure the trach stays in place until the new ties are secured. (If working alone, apply and secure the new holder before you remove the dirty holder.) Follow these steps to replace the holder: 1. Position  the strap around the back of the neck. 2. Slide the narrow ends under and through the faceplate. 3. Gently pull the ends of the strap so they are tight and secured. For comfort, make sure you can fit 1-2 finger widths between the neck and the strap.  How to finish the process 1. Throw away any used supplies. 2. Remove your gloves, and have your helper remove his or her gloves. 3. Wash your hands, and have your helper wash his or her hands. This information is not intended to replace advice given to you by your health care provider. Make sure you discuss any questions you have with  your health care provider. Document Released: 06/16/2012 Document Revised: 01/24/2016 Document Reviewed: 07/13/2015 Elsevier Interactive Patient Education  2018 [employer]. F/U with Dr. Ihor Austin at [facility] next week.   Diet SLP Diet Recommendations Dysphagia 3 (Mech soft) solids;Thin liquid  Liquid Administration via Spoon  Medication Administration Whole meds with puree  Compensations Minimize environmental distractions;Slow rate;Small sips/bites;Multiple dry swallows after each bite/sip;Hard cough after swallow  Postural Changes Remain semi-upright after after feeds/meals (Comment);Seated upright at 90 degrees

## 2018-09-20 NOTE — Consult Note (Signed)
PHARMACY CONSULT NOTE - FOLLOW UP  Pharmacy Consult for Electrolyte Monitoring and Replacement   Recent Labs: Potassium (mmol/L)  Date Value  09/20/2018 3.4 (L)   Magnesium (mg/dL)  Date Value  09/20/2018 1.9   Calcium (mg/dL)  Date Value  09/20/2018 8.3 (L)   Albumin (g/dL)  Date Value  09/12/2018 3.2 (L)   Phosphorus (mg/dL)  Date Value  09/20/2018 4.0    Assessment: Pharmacy consulted for electrolyte monitoring and replacement in 52 yo male admitted with upper airway obstruction. Patient had Gastrostomy tube placed 12/12.   Goal of Therapy:  Electrolytes WNL  Plan:  12/16 K: 3.4, Phos: 4.0, Mg: 1.9. Will order KCl 33mEq x 1 dose.   Will recheck electrolytes with AM labs.   Pharmacy will continue to follow and replace electrolytes as needed.   Pernell Dupre, PharmD, BCPS Clinical Pharmacist 09/20/2018 9:39 AM

## 2018-12-22 ENCOUNTER — Encounter: Payer: Self-pay | Admitting: Emergency Medicine

## 2018-12-22 ENCOUNTER — Emergency Department
Admission: EM | Admit: 2018-12-22 | Discharge: 2018-12-22 | Disposition: A | Discharge status: Home / Self Care | Payer: Medicaid Other | Attending: Emergency Medicine | Admitting: Emergency Medicine

## 2018-12-22 DIAGNOSIS — I10 Essential (primary) hypertension: Secondary | ICD-10-CM | POA: Insufficient documentation

## 2018-12-22 DIAGNOSIS — T85528A Displacement of other gastrointestinal prosthetic devices, implants and grafts, initial encounter: Secondary | ICD-10-CM

## 2018-12-22 DIAGNOSIS — Z8521 Personal history of malignant neoplasm of larynx: Secondary | ICD-10-CM | POA: Insufficient documentation

## 2018-12-22 DIAGNOSIS — F17200 Nicotine dependence, unspecified, uncomplicated: Secondary | ICD-10-CM | POA: Diagnosis not present

## 2018-12-22 DIAGNOSIS — Z431 Encounter for attention to gastrostomy: Secondary | ICD-10-CM | POA: Insufficient documentation

## 2018-12-22 HISTORY — DX: Malignant neoplasm of pharynx, unspecified: C14.0

## 2018-12-22 MED ORDER — MORPHINE SULFATE (PF) 4 MG/ML IV SOLN
4.0000 mg | Freq: Once | INTRAVENOUS | Status: AC
  Administered 2018-12-22: 4 mg via INTRAMUSCULAR
  Filled 2018-12-22: qty 1

## 2018-12-22 NOTE — ED Notes (Signed)
Patient's brother was notified of discharge and was on his way to pick him up.

## 2018-12-22 NOTE — ED Triage Notes (Signed)
Pt arrived via EMS from home where he reported he noticed clothing wet from G-Tube displacement. Pt is A&O x4. Pt currently receiving radiation therapy for throat cancer. No other symptoms reported by pt.

## 2018-12-22 NOTE — ED Notes (Signed)
Attempted to call brother for transport but left message to call back.

## 2018-12-22 NOTE — ED Notes (Signed)
Patient left with brother.

## 2018-12-22 NOTE — ED Notes (Signed)
MD changed out G-tube. RN flushed to confirm placement.

## 2018-12-22 NOTE — ED Provider Notes (Signed)
[facility] Emergency Department Provider Note  Time seen: 10:36 PM  I have reviewed the triage vital signs and the nursing notes.   HISTORY  Chief Complaint Dislodged G-tube  HPI Alan Chung is a 53 y.o. male with a past medical history of throat cancer status post tracheostomy, G-tube, presents to the emergency department for dislodged G-tube.  According to the patient approximately 3 hours ago his G-tube fell out.  It has been in a couple months per patient.  Patient denies any pain besides his typical throat pain.  Patient is reliant on the G-tube so came to the emergency department for evaluation.   Past Medical History:  Diagnosis Date  . Hypertension   . Throat cancer [facility])     Patient Active Problem List   Diagnosis Date Noted  . Feeding by G-tube ([place])   . Laryngeal cancer ([place])   . Malnutrition of moderate degree 09/13/2018  . Laryngeal mass 09/11/2018  . Tracheostomy, acute management ([place]) 09/10/2018  . Acute respiratory failure ([place]) 09/10/2018    Past Surgical History:  Procedure Laterality Date  . DIAGNOSTIC LARYNGOSCOPY  09/10/2018   Procedure: DIAGNOSTIC LARYNGOSCOPY WITH BIOPSY;  Surgeon: Carloyn Manner, MD;  Location: [facility] ORS;  Service: ENT;;  . IR GASTROSTOMY TUBE MOD SED  09/16/2018  . right ankle surgery    . TRACHEOSTOMY TUBE PLACEMENT N/A 09/10/2018   Procedure: TRACHEOSTOMY;  Surgeon: Carloyn Manner, MD;  Location: [facility] ORS;  Service: ENT;  Laterality: N/A;    Prior to Admission medications   Medication Sig Start Date End Date Taking? Authorizing Provider  albuterol (PROVENTIL HFA;VENTOLIN HFA) 108 (90 Base) MCG/ACT inhaler Inhale 2 puffs into the lungs every 6 (six) hours as needed for wheezing or shortness of breath. 08/24/18   Lannie Fields, PA-C  ibuprofen (ADVIL,MOTRIN) 800 MG tablet Take 800 mg by mouth every 8 (eight) hours as needed.    [provider]  oxyCODONE 10 MG TABS Take 1 tablet (10 mg  total) by mouth every 6 (six) hours as needed for severe pain. 09/20/18   Hillary Bow, MD    Allergies  Allergen Reactions  . Tramadol Itching    History reviewed. No pertinent family history.  Social History Social History   Tobacco Use  . Smoking status: Current Every Day Smoker  . Smokeless tobacco: Never Used  Substance Use Topics  . Alcohol use: No  . Drug use: No    Review of Systems Constitutional: Negative for fever. Cardiovascular: Negative for chest pain. Respiratory: Negative for shortness of breath.  Negative for cough Gastrointestinal: Negative for abdominal pain.  Dislodged G-tube All other ROS negative  ____________________________________________   PHYSICAL EXAM:  VITAL SIGNS: ED Triage Vitals [12/22/18 2158]  Enc Vitals Group     BP (!) 159/89     Pulse Rate 69     Resp 18     Temp 98.2 F (36.8 C)     Temp Source Oral     SpO2 96 %     Weight      Height      Head Circumference      Peak Flow      Pain Score      Pain Loc      Pain Edu?      Excl. in [place]?     Constitutional: Awake and awake and alert, no acute distress. Eyes: Normal exam ENT   Head: Normocephalic and atraumatic.   Mouth/Throat: Mucous membranes are moist.  Cardiovascular: Normal rate, regular rhythm. No murmur Respiratory: Normal respiratory effort without tachypnea nor retractions. Breath sounds are clear Gastrointestinal: Soft and nontender. No distention.  Dislodged G-tube site. Musculoskeletal: Nontender with normal range of motion in all extremities.  Neurologic:  No gross focal neurologic deficits Skin:  Skin is warm, dry and intact.  Psychiatric: Mood and affect are normal.     INITIAL IMPRESSION / ASSESSMENT AND PLAN / ED COURSE  Pertinent labs & imaging results that were available during my care of the patient were reviewed by me and considered in my medical decision making (see chart for details).  Patient presents to the emergency department  with a dislodged G-tube.  [place] out approximately 3 hours prior.  No other acute complaints.  Attempted to replace G-tube, unable to get 83 [place] G-tube through the ostomy.  Dose 4 mg IM morphine.  Used topical lidocaine and a urethral dilation kit to dilate to 20 [place].  At that time we are able to remove the 20 [place] dilator and placed a 32 [place] G-tube without resistance.  20 cc of saline inserted into the balloon.  Gastric contents and flushes well.  We will discharge back to home. ____________________________________________   FINAL CLINICAL IMPRESSION(S) / ED DIAGNOSES  Dislodged G-tube   Harvest Dark, MD 12/22/18 2248

## 2019-04-02 IMAGING — CR DG CHEST 2V
1 series · 2 of 2 positions shown · non-contrast
Comparison: None.

CLINICAL DATA: Sore throat for 2 months. Laryngitis. Nonproductive
cough. Smoker.

EXAM:
CHEST - 2 VIEW

[Series 1: dg chest 2 view · 0.14mm/px · 2 of 2 slices shown]
[im 1/2]
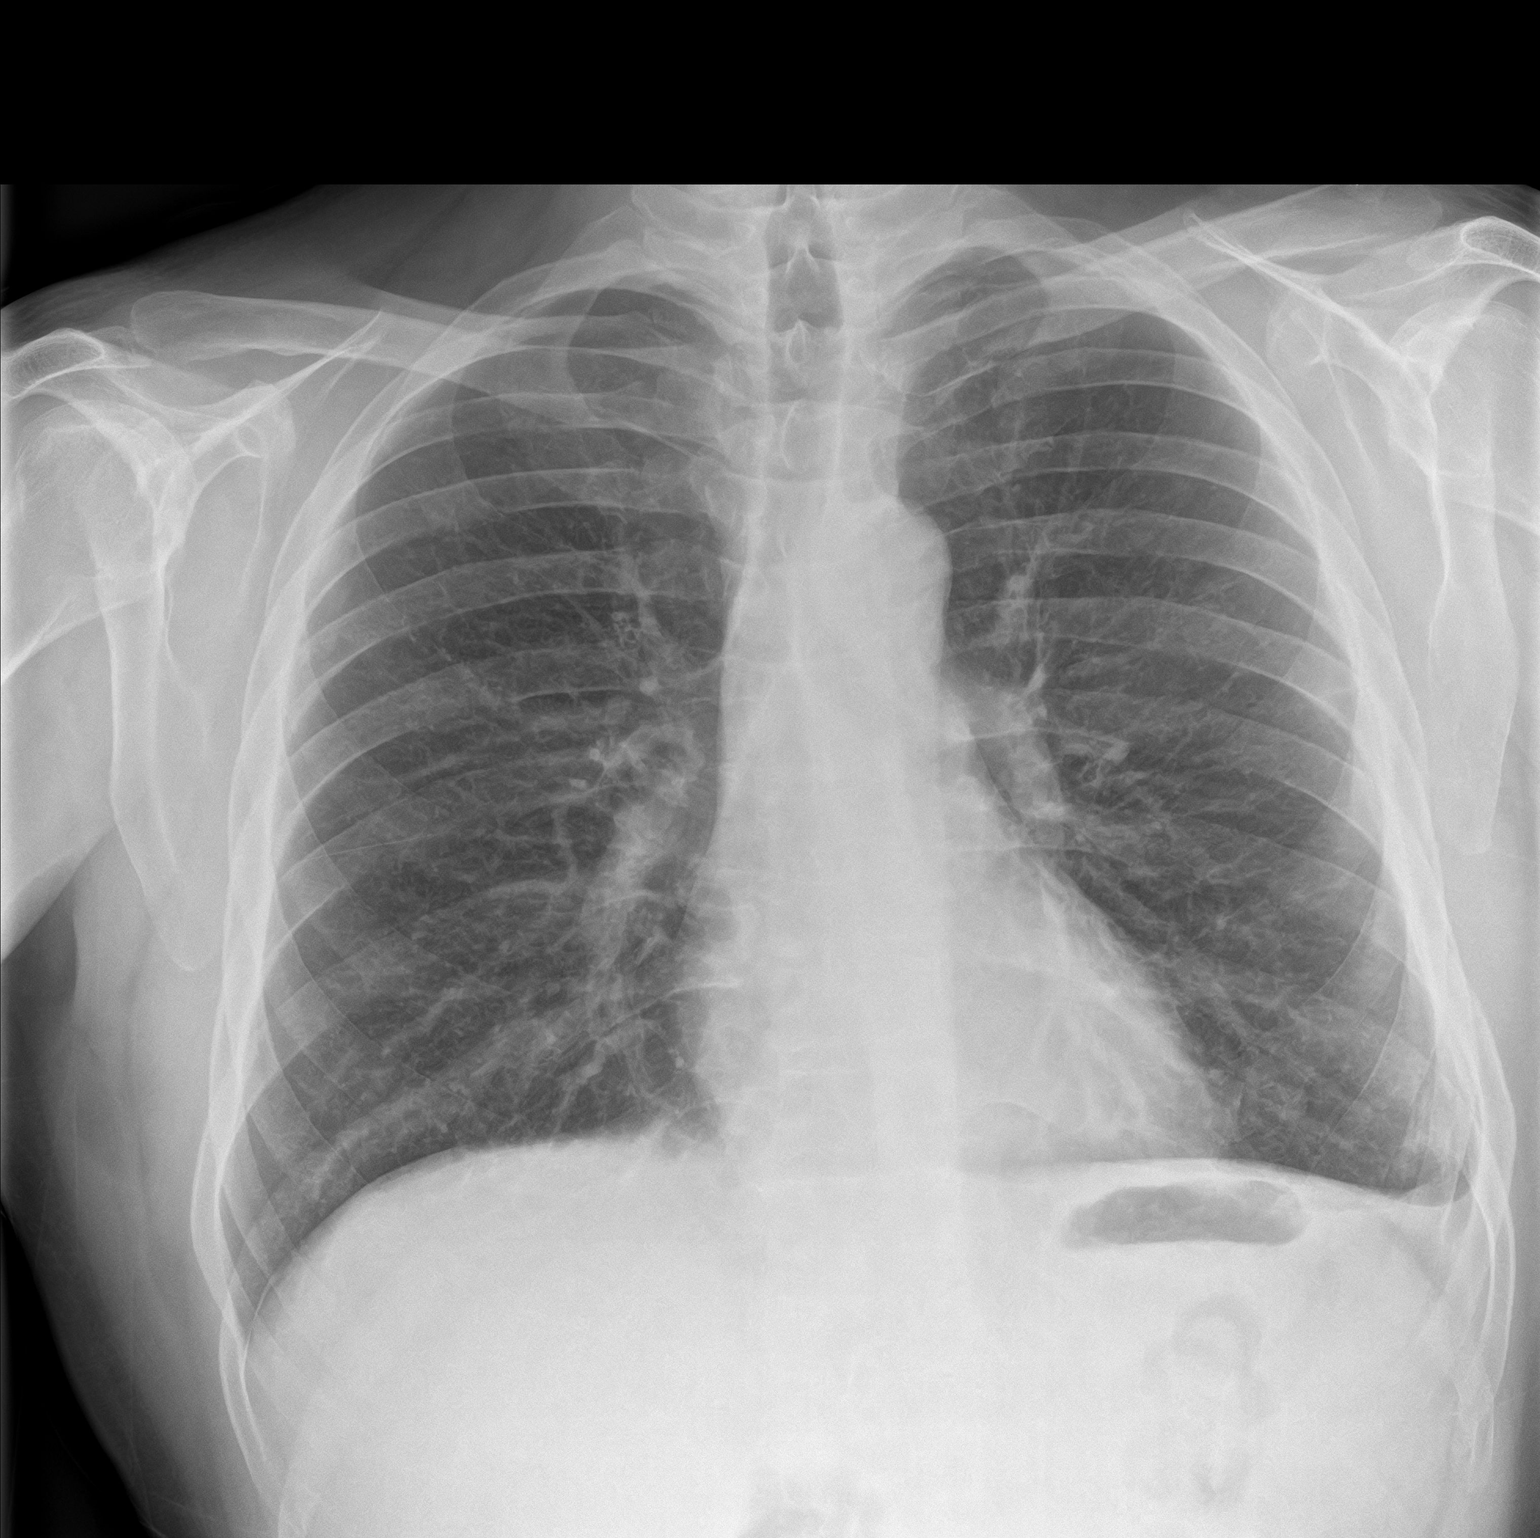
[im 2/2]
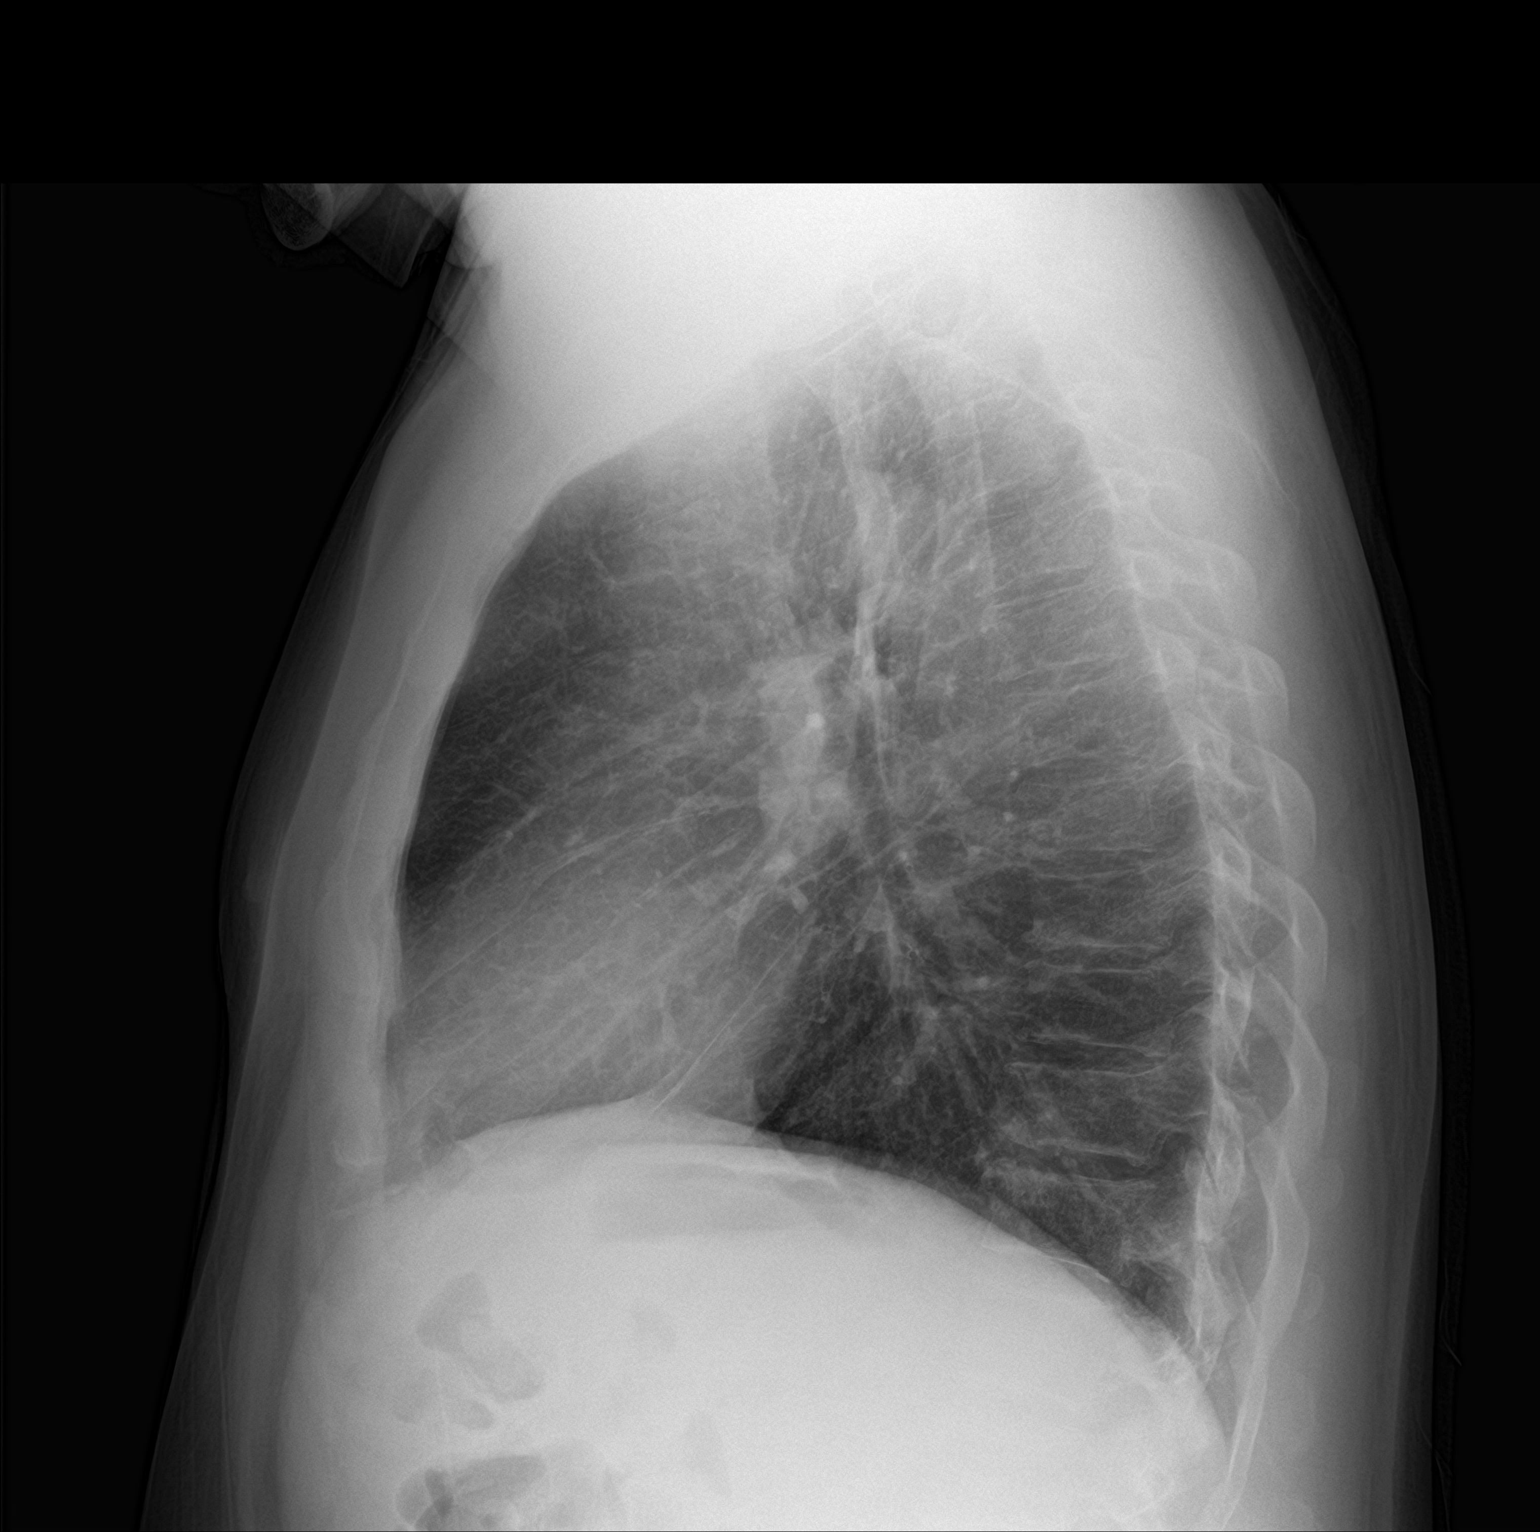

[2 of 2 positions shown; findings below may reference images not displayed]

FINDINGS: Normal heart size and pulmonary vascularity. No airspace disease or
consolidation in the lungs. Blunting of the left costophrenic angle
likely representing a small pleural effusion. No pneumothorax.
Mediastinal contours appear intact.
IMPRESSION: Small left pleural effusion. No evidence of active pulmonary
disease.

## 2019-04-23 IMAGING — DX DG ABDOMEN 1V
1 series · 1 of 1 positions shown · non-contrast
Comparison: 09/13/2018 chest radiograph. 09/12/2018 abdomen
radiograph.

CLINICAL DATA: 52 y/o M; aspiration into airway. NG tube placement.

EXAM:
PORTABLE CHEST 1 VIEW
ABDOMEN RADIOGRAPH, 1 VIEW

[abdomen supine]
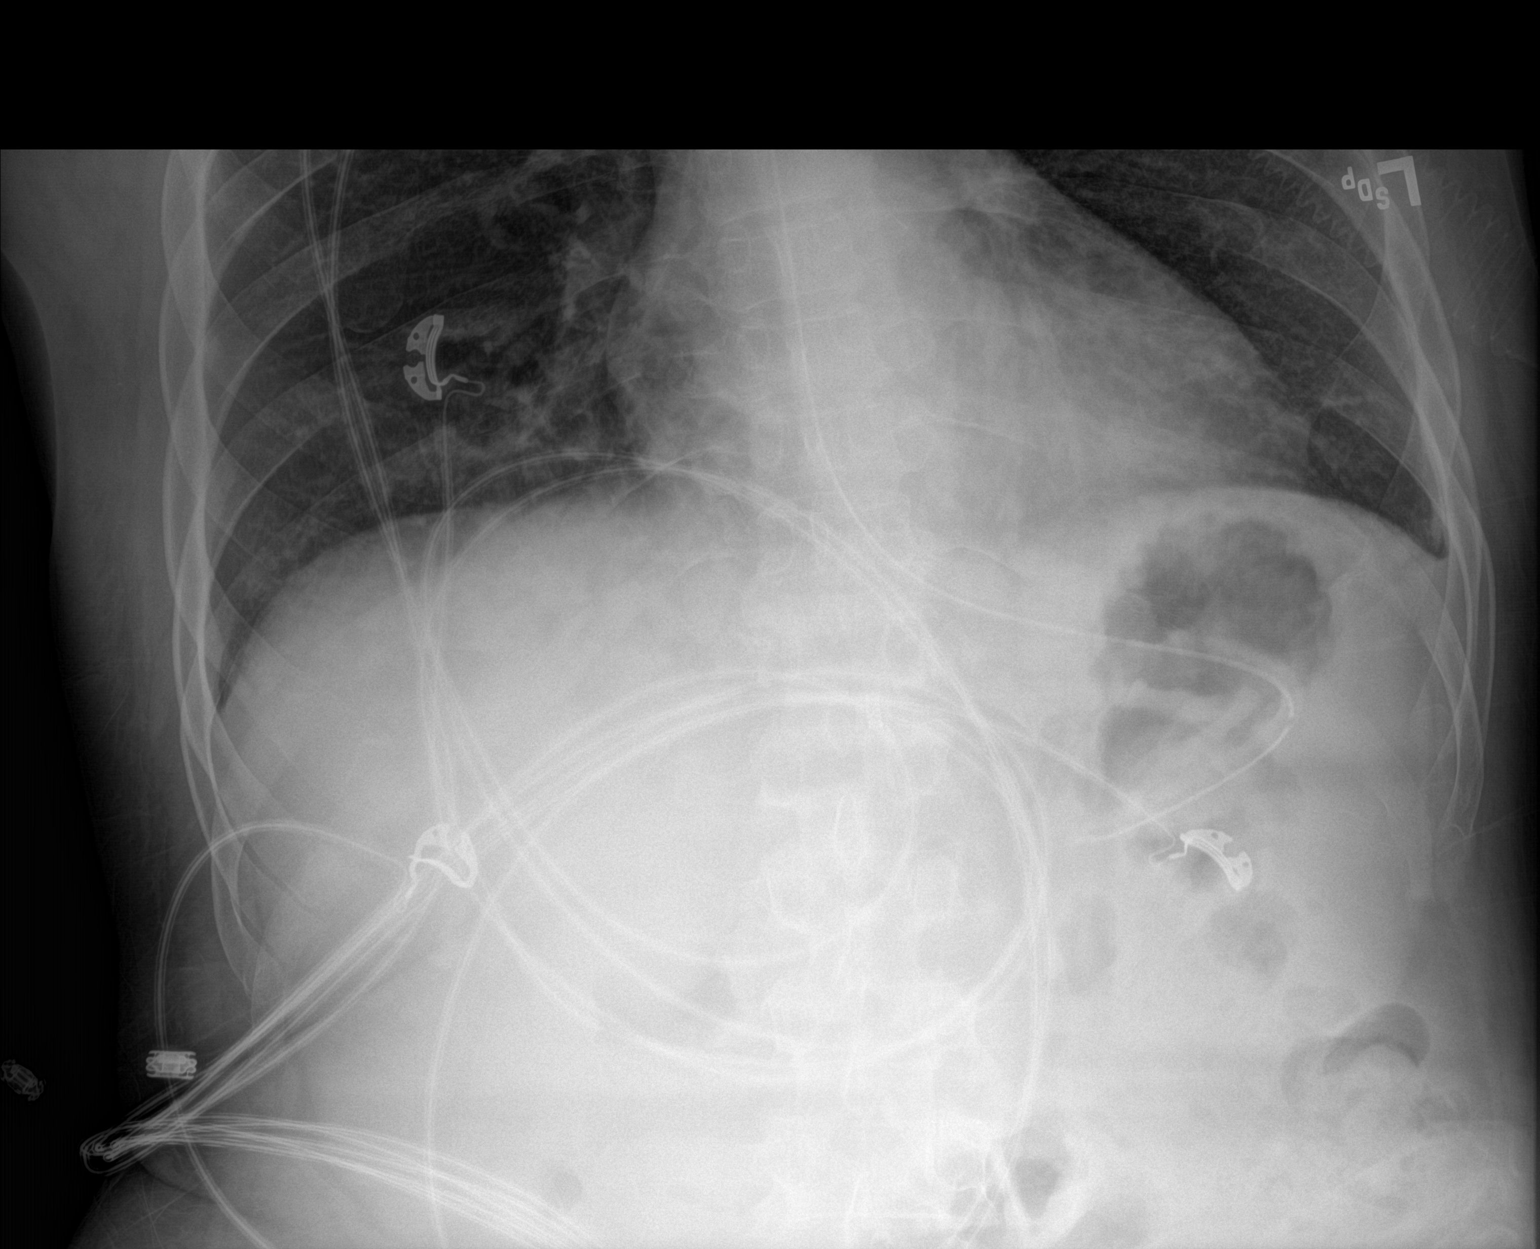

[1 of 1 positions shown; findings below may reference images not displayed]

FINDINGS: Chest:

Stable cardiac silhouette given projection and technique within
normal limits. Tracheostomy tube projecting over trachea. Left
basilar ill-defined retrocardiac opacity. No pleural effusion or
pneumothorax. Enteric tube tip extends below the field of view into
the abdomen. No acute osseous abnormality identified.

Abdomen:

Enteric tube tip projects over gastric body. Upper abdominal bowel
gas pattern is normal.
IMPRESSION: Enteric tube tip projects over gastric body. Ill-defined left
basilar opacity may represent atelectasis, pneumonia, or aspiration.

## 2019-04-26 IMAGING — XA IR PERC PLACEMENT GASTROSTOMY
3 series · 3 of 3 positions shown · non-contrast
Comparison: none

INDICATION: Laryngeal tumor

[Series 1: fl - angio · 1 of 1 slices shown (1 of 3)]
[im 1/1]
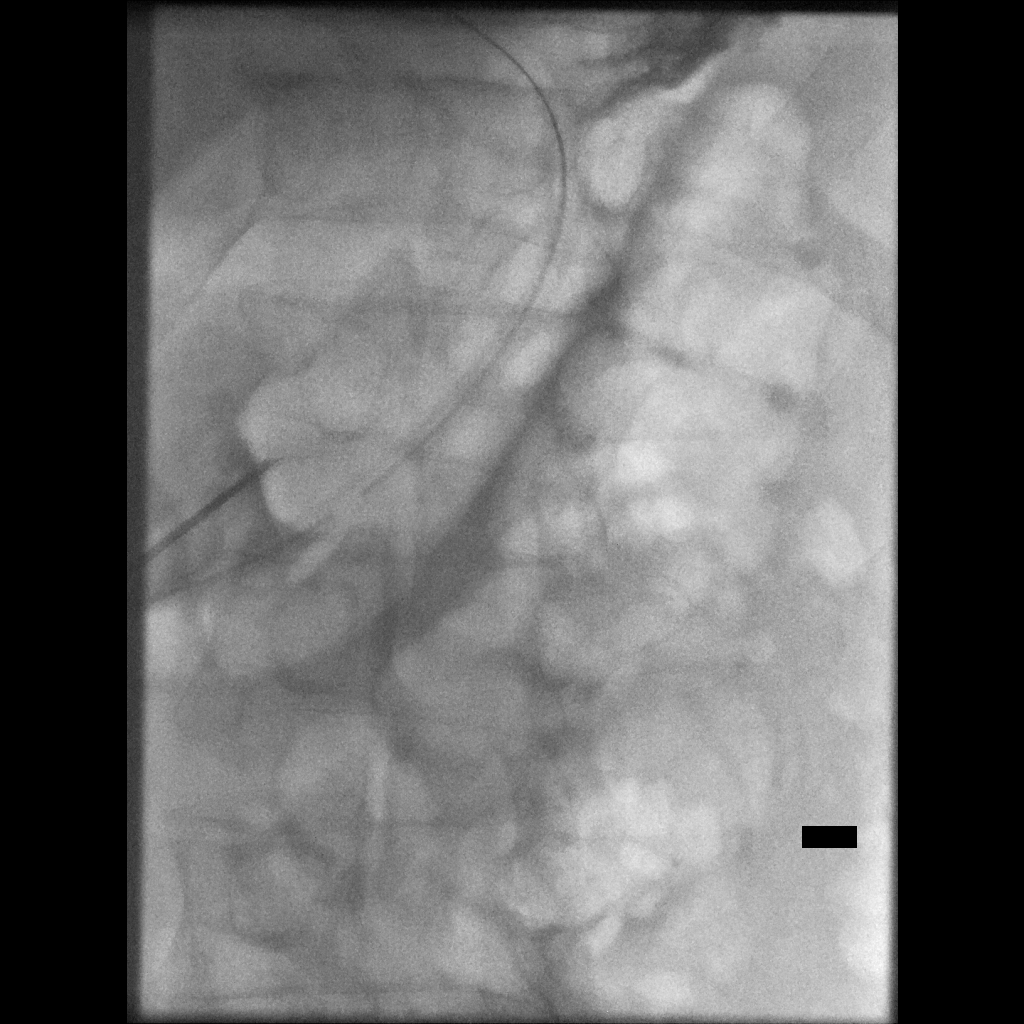

[Series 2: fl - angio · 1 of 1 slices shown (2 of 3)]
[im 1/1]
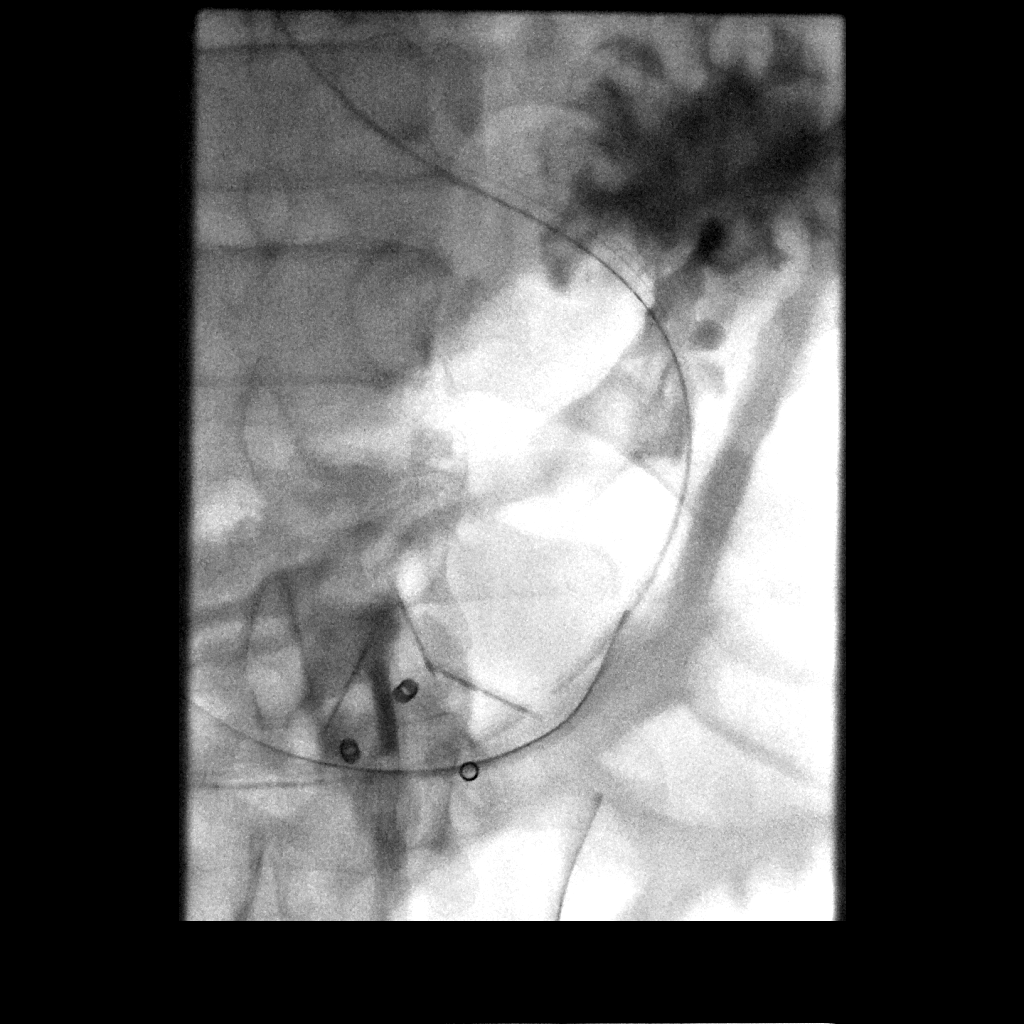

[Series 3: fl - angio · 1 of 1 slices shown (3 of 3)]
[im 1/1]
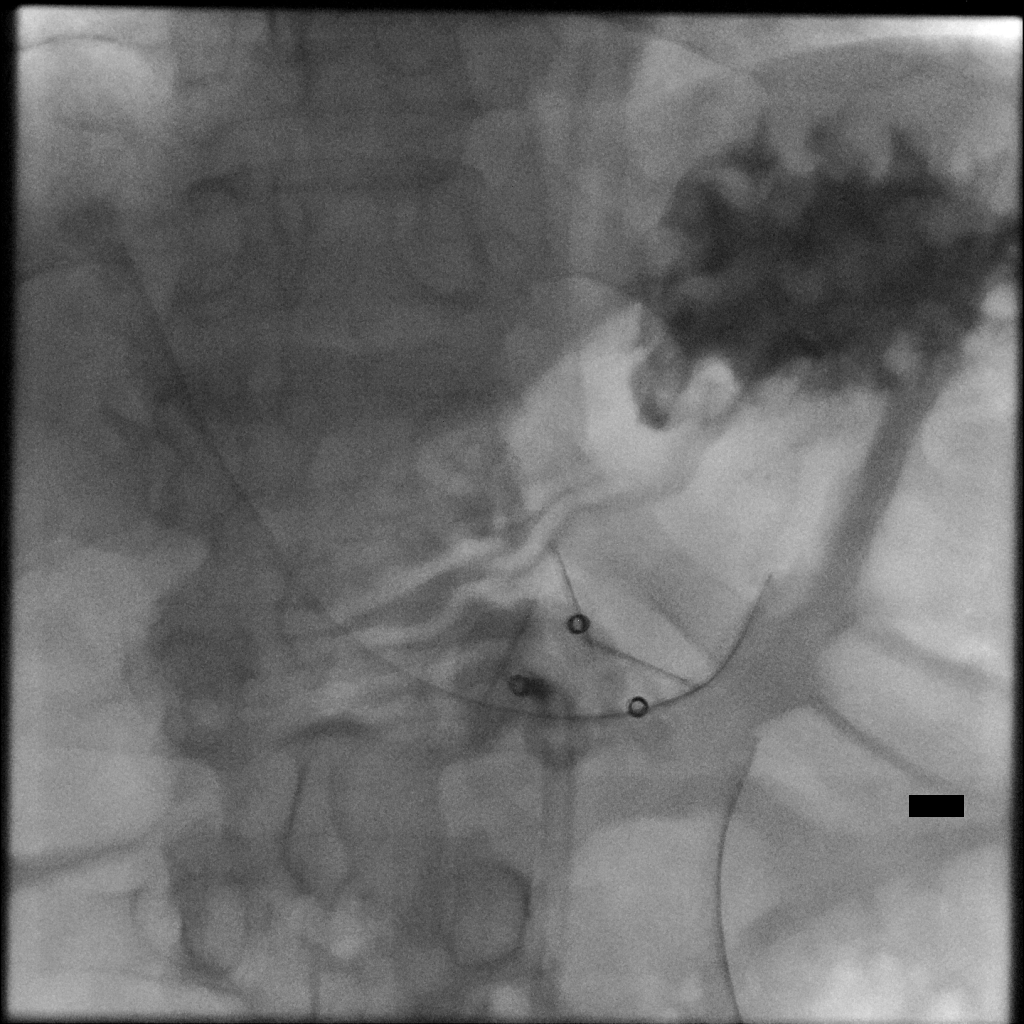

[3 of 3 positions shown; findings below may reference images not displayed]

EXAM:
PERC PLACEMENT GASTROSTOMY

MEDICATIONS:
The patient is currently on IV antibiotics

ANESTHESIA/SEDATION:
Versed 3 mg IV; Fentanyl 150 mcg IV

Moderate Sedation Time:  20 minutes

The patient was continuously monitored during the procedure by the
interventional radiology nurse under my direct supervision.

CONTRAST:  10mL 74YUVK-ITT IOPAMIDOL (74YUVK-ITT) INJECTION 61% -
administered into the gastric lumen.

FLUOROSCOPY TIME:  Fluoroscopy Time: 3 minutes 6 seconds (124 mGy).

COMPLICATIONS:
None immediate.

PROCEDURE:
The procedure, risks, benefits, and alternatives were explained to
the patient. Questions regarding the procedure were encouraged and
answered. The patient understands and consents to the procedure.

The epigastrium was prepped with Betadine in a sterile fashion, and
a sterile drape was applied covering the operative field. A sterile
gown and sterile gloves were used for the procedure.

1 mg glucagon was administered. The balloon was distended with gas
via the NG tube.

1% lidocaine was utilized for local anesthesia. Under fluoroscopic
guidance, an 18 gauge needle was advanced into the lumen of the
stomach via the anterior wall. A T tack faster was then advanced
through the needle. This was repeated and additional 2 times for 3 T
tacks. This functioned as a pexy.

Under fluoroscopic guidance, an 18 gauge needle was advanced into
the lumen of the stomach. It was removed over an Amplatz wire. The
tract was dilated to 20 French. A 20 French peel-away sheath was
inserted over the wire into the stomach. An 18 French balloon
retention gastrostomy tube was then advanced through the peel-away
sheath. The peel-away sheath was removed. 10 cc was utilized to
insufflate the balloon. Contrast was then injected into the
gastrostomy tube.
FINDINGS: Images demonstrate placement of an 18 French balloon retention
gastrostomy tube into the lumen of the stomach. Contrast fills the
lumen of the stomach.
IMPRESSION: Successful placement of an 18 French balloon retention gastrostomy
tube.

## 2019-08-07 DEATH — deceased
